# Patient Record
Sex: Female | Born: 1980 | Race: White | Hispanic: No | Marital: Married | Smoking: Never smoker
Health system: Southern US, Community
[De-identification: ages and names within clinical notes are randomized; demographics above are authoritative.]

## PROBLEM LIST (undated history)

## (undated) DIAGNOSIS — R748 Abnormal levels of other serum enzymes: Secondary | ICD-10-CM

## (undated) DIAGNOSIS — O4400 Placenta previa specified as without hemorrhage, unspecified trimester: Secondary | ICD-10-CM

## (undated) DIAGNOSIS — Z8619 Personal history of other infectious and parasitic diseases: Secondary | ICD-10-CM

## (undated) DIAGNOSIS — H3320 Serous retinal detachment, unspecified eye: Secondary | ICD-10-CM

## (undated) HISTORY — PX: RETINAL DETACHMENT SURGERY: SHX105

## (undated) HISTORY — DX: Abnormal levels of other serum enzymes: R74.8

## (undated) HISTORY — DX: Personal history of other infectious and parasitic diseases: Z86.19

---

## 2010-07-28 ENCOUNTER — Other Ambulatory Visit: Admission: RE | Admit: 2010-07-28 | Discharge: 2010-07-28 | Payer: Self-pay | Admitting: Family Medicine

## 2011-05-12 LAB — ANTIBODY SCREEN: Antibody Screen: NEGATIVE

## 2011-05-12 LAB — RPR: RPR: NONREACTIVE

## 2011-05-12 LAB — RUBELLA ANTIBODY, IGM: Rubella: IMMUNE

## 2011-05-12 LAB — ABO/RH: RH Type: NEGATIVE

## 2011-05-12 LAB — HIV ANTIBODY (ROUTINE TESTING W REFLEX): HIV: NONREACTIVE

## 2011-05-12 LAB — HEPATITIS B SURFACE ANTIGEN: Hepatitis B Surface Ag: NEGATIVE

## 2011-06-02 LAB — GC/CHLAMYDIA PROBE AMP, GENITAL
Chlamydia: NEGATIVE
Gonorrhea: NEGATIVE

## 2011-07-05 ENCOUNTER — Other Ambulatory Visit (HOSPITAL_COMMUNITY): Payer: Self-pay | Admitting: Obstetrics and Gynecology

## 2011-07-05 DIAGNOSIS — Z139 Encounter for screening, unspecified: Secondary | ICD-10-CM

## 2011-07-14 ENCOUNTER — Ambulatory Visit (HOSPITAL_COMMUNITY)
Admission: RE | Admit: 2011-07-14 | Discharge: 2011-07-14 | Disposition: A | Discharge status: Home / Self Care | Payer: BC Managed Care – PPO | Source: Ambulatory Visit | Attending: Obstetrics and Gynecology | Admitting: Obstetrics and Gynecology

## 2011-07-14 ENCOUNTER — Encounter (HOSPITAL_COMMUNITY): Payer: Self-pay

## 2011-07-14 ENCOUNTER — Other Ambulatory Visit: Payer: Self-pay | Admitting: Maternal and Fetal Medicine

## 2011-07-14 DIAGNOSIS — Z1389 Encounter for screening for other disorder: Secondary | ICD-10-CM | POA: Insufficient documentation

## 2011-07-14 DIAGNOSIS — Z139 Encounter for screening, unspecified: Secondary | ICD-10-CM

## 2011-07-14 DIAGNOSIS — Z363 Encounter for antenatal screening for malformations: Secondary | ICD-10-CM | POA: Insufficient documentation

## 2011-07-14 DIAGNOSIS — O358XX0 Maternal care for other (suspected) fetal abnormality and damage, not applicable or unspecified: Secondary | ICD-10-CM | POA: Insufficient documentation

## 2011-07-14 NOTE — Progress Notes (Signed)
Please see ultrasound report in [facility] 

## 2011-07-14 NOTE — Progress Notes (Signed)
See ultrasound report in [facility] 

## 2011-07-17 ENCOUNTER — Encounter (HOSPITAL_COMMUNITY): Payer: Self-pay

## 2011-07-17 DIAGNOSIS — Z8489 Family history of other specified conditions: Secondary | ICD-10-CM | POA: Insufficient documentation

## 2011-07-17 NOTE — Progress Notes (Addendum)
Genetic Counseling  High-Risk Gestation Note  Appointment Date:  07/14/2011 Referred By: Jessee Avers., MD Date of Birth:  11-06-1980 Partner:  Jonita Albee Attending: Rica Koyanagi, MD  Pregnancy History: [id number] Estimated Date of Delivery: 11/29/11 Estimated Gestational Age: [redacted]w[redacted]d  Mrs. Sammuel Bailiff and her husband, Mr. 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  Mr. Walstad reported two maternal second cousins (a brother and a sister to each other) with cystinosis. They are 32 years old and 32 years old, and the younger was diagnosed at birth following the diagnosis of her brother. It is unknown at this time whether or not genetic testing has been performed.  Cystinosis is an autosomal recessive lysosomal storage disease divided into 3 clinical types: nephropathic, intermediate, and non-nephropathic. Nephropathic cystinosis accounts for the majority of cases and in untreated individuals is characterized by short stature, rickets, photophobia, and renal tubular Fanconi syndrome by age 32 with renal failure by age 32. Treatment consists of phosphate and vitamin D supplements, cystine depleting therapy, cysteamine hydrochloride eyedrops, possible hormone replacements therapies (L-thyroxine, insulin, growth hormone, and/or testosterone), and renal transplant.   Intermediate cystinosis is characterized by similar manifestations but later age of onset. Non-nephropathic cystinosis is characterized by photophobia due to corneal cystine crystal accumulation.  Diagnosis of nephropathic cystinosis is established by presence of renal tubular Fanconi syndrome, appearance of cystine crystals in the cornea on slit lamp exam, and increased cystine content of leukocytes. Mutations in CTNS gene are known to cause cystinosis.   We reviewed the autosomal recessive inheritance of  cystinosis, where both parents must be carriers for a pregnancy to be at risk to inherit the condition. If both parents are carriers, each pregnancy has a 1 in 4 chance to be affected, a 1 in 2 chance to be a carrier, and a 1 in 4 chance to be neither affected nor a carrier. We discussed that if both parents are known carriers, prenatal diagnosis via amniocentesis could be performed for the known familial mutations. The risks, benefits, and limitations of amniocentesis were reviewed. The prevalence of cystinosis is approximately 1 in 100,000 to 1 in 200,000. Thus, the general population carrier frequency is approximately 1 in 158. Carrier testing for at-risk relatives is available once the familial disease-causing mutation(s) have been identified. Given the reported family history, Mr. Delamar would have a 1 in 8 chance to be a carrier for cystinosis, and Mrs. Kretz would have the general population risk to be a carrier given no known family history nor consanguinity. Thus, the risk for cystinosis in the current pregnancy, prior to carrier testing for Mr. Walski is approximately 1 in 44. Mr. Kreher expressed that he would be interested in carrier testing, but the couple stated that they would not be interested in amniocentesis. The couple expressed interest in carrier testing so that they could potentially plan whether or not to test for cystinosis in the neonatal period. We reviewed that biochemical testing could be performed in the postnatal period regardless of whether or not carrier testing was performed. The couple expressed their understanding but would like to pursue carrier testing. Mr. Castillo plans to obtain medical record information regarding genetic testing results for his second cousins and contact our office once obtained in order to pursue carrier testing for the known familial mutations.   Additionally, Mrs. Racanelli reported a maternal family history of "Anatomical Dystrophy."  She reported that her  maternal grandmother had mild symptoms and passed away in her 63s. Her maternal uncle passed away at age 8, and the condition had progressed for him such that he was blind and in a wheelchair. Her maternal aunt is currently 101 and has recently had symptoms of the condition, described as shaking. Mrs. Meriweather reported that her maternal aunts' two daughters have been tested and were found to also have the condition but are asymptomatic in their 30s and 65s. Mrs. Armenteros's mother was reportedly evaluated at [facility] and was negative for this condition. The patient reported that she was told that this condition can get worse with each generation.   We discussed that the description of the condition in the family is similar with that of myotonic dystrophy. Myotonic dystrophy (DM) is a multisystem disorder characterized by the following progressive symptoms: myotonia (sustained muscle contraction), muscle weakness, pain and wasting, myotonia (sustained muscle contraction), muscle weakness and wasting, endocrine problems including diabetes, cataracts and cardiac conduction problems.  These features can range from mild to severe and may be different for each affected individual.  The life span may be normal or shortened.  There are two types of DM including type 1 (the most common type, noted as DM1) and type 2 (DM2).  They are commonly categorized as mild, classic or congenital.  Congenital DM describes when symptoms are present at or shortly after birth, which can include low muscle tone, respiratory problems, mental retardation and early death.  A congenital form of DM has been documented for DM1 but not for DM2.  Symptomatic treatment is available for many features of Myotonic dystrophy; a cure is not currently available for this condition.  DM1 and DM2 result from an expansion of a segment in either the DMPK gene on chromosome 78 (DM1) or in the CNBP gene on chromosome 3 (DM2). Both follow autosomal dominant  inheritance, where offspring of an affected individual have a 1 in 2 (50%) chance to also inherit the nonworking gene change. We discussed that individuals who do not inherit the mutation are not at increased risk to have a child with the condition. A phenomenon called anticipation has been observed with DM1 and DM2.  This means that when the expanded repeat is transmitted from parent to child, the repeat size is inclined to increase in size.  This means that the child of an affected parent commonly has more repeats than his or her parent has.  There appears to be a direct correlation between repeat size and severity and age-of-onset of symptoms for DM1, meaning the more repeats a person has, the earlier their symptoms typically present and the more severe their symptoms tend to be.  This correlation has not been apparent or consistent for DM2. Clinical genetic testing is available for myotonic dystrophy. Testing is most informative in asymptomatic at-risk relatives when genetic testing has first confirmed the presence of a genetic mutation in affected relatives. Given the reported family history, if Mrs. Galloway's mother has had negative testing for a known mutation causative of an autosomal dominant condition, then Mrs. Alviar would not be at increased risk to have inherited it, which means the current pregnancy would also not be expected to be at increased risk. However, in the case of a different underlying condition and/or in the case that genetic testing was not performed for Mrs. Litzinger's mother, recurrence risk estimate may change. Mrs. Cristiano planned to discuss this history with her mother and obtain medical records  in order to confirm the diagnosis of the condition in the family and the specific testing that her mother had performed.  Without further information regarding the provided family history, an accurate genetic risk cannot be calculated.  Additional information may alter recurrence risk assessment.    The patient denied exposure to environmental toxins or chemical agents.  She denied the use of tobacco or street drugs. She reported drinking alcohol twice in June, prior to being aware of the pregnancy.  Prenatal alcohol exposure can increase the risk for growth delays, small head size, heart defects, eye and facial differences, as well as behavior problems and learning disabilities. The risk of these to occur tends to increase with the amount of alcohol consumed. However, because there is no identified safe amount of alcohol in pregnancy, it is recommended to completely avoid alcohol in pregnancy. Given the reported amount and timing of exposure, risk for associated effects are not likely increased in the current pregnancy. She denied significant viral illnesses during the course of her pregnancy.  Her medical and surgical history were noncontributory.   A complete obstetrical ultrasound was performed at the time of today's evaluation.  The ultrasound report is reported separately.     We counseled the patient for approximately 40 minutes regarding the above risks.     Clydie Braun Rommel Hogston, MS, CGC 07/17/2011    Addendum 07/27/11 Received medical records from patient regarding her mother's normal genetic testing for both myotonic dystrophy 1 (DM1) and myotonic dystrophy 2 (DM2) through [employer]. The patient's mother was seen for genetic counseling at [facility] regarding the family history of myotonic dystrophy. Medical records confirm that the muscle weakness condition in the maternal relatives is myotonic dystrophy type 1 (DM1).  Given that her mother had normal genetic testing for both DM1 and DM2, Mrs. Conradt is not at risk to have inherited the DM1 causative mutation in the family. Thus, she is not at increased risk for myotonic dystrophy, which means that her offspring are also not at increased risk for DM1. Given this information, genetic testing for DM1 is not indicated  for Mrs. Quiett.

## 2011-07-24 ENCOUNTER — Telehealth (HOSPITAL_COMMUNITY): Payer: Self-pay | Admitting: MS"

## 2011-07-24 NOTE — Telephone Encounter (Signed)
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 Discussed with patient that her risk to be carrier is reduced. Patient's mother is sending her medical records regarding the testing performed for family history of dystrophy, and the patient and her partner plan to follow-up with his cousins regarding the history of cystinosis. She will contact me once records are received.

## 2011-07-27 ENCOUNTER — Encounter (HOSPITAL_COMMUNITY): Payer: Self-pay | Admitting: MS"

## 2011-08-25 ENCOUNTER — Ambulatory Visit (HOSPITAL_COMMUNITY)
Admission: RE | Admit: 2011-08-25 | Discharge: 2011-08-25 | Disposition: A | Discharge status: Home / Self Care | Payer: BC Managed Care – PPO | Source: Ambulatory Visit | Attending: Obstetrics and Gynecology | Admitting: Obstetrics and Gynecology

## 2011-08-25 DIAGNOSIS — Z139 Encounter for screening, unspecified: Secondary | ICD-10-CM

## 2011-08-25 DIAGNOSIS — Z3689 Encounter for other specified antenatal screening: Secondary | ICD-10-CM | POA: Insufficient documentation

## 2011-08-25 DIAGNOSIS — O352XX0 Maternal care for (suspected) hereditary disease in fetus, not applicable or unspecified: Secondary | ICD-10-CM | POA: Insufficient documentation

## 2011-08-25 NOTE — Progress Notes (Signed)
Ms. Rish was seen for ultrasound appointment today.  Please see [facility] report for details.

## 2011-10-10 NOTE — L&D Delivery Note (Signed)
Delivery Note At 8:12 PM a viable female was delivered via Vaginal, Spontaneous Delivery (Presentation: ;  ).  APGAR: 8, 9; weight 7 lb 14.3 oz (3580 g).   Placenta status: Intact, Spontaneous.  Cord: 3 vessels with the following complications: None.  Cord pH: na  Anesthesia: Epidural  Episiotomy: None Lacerations: 1st degree Suture Repair: 3.0 vicryl Est. Blood Loss (mL): 800  Mom to postpartum.  Baby to nursery-stable. Error above mom to AICU due to HELLP syndrome for Magnesium sulfate Lamerle Jabs J. 11/30/2011, 8:42 PM

## 2011-10-31 LAB — STREP B DNA PROBE: GBS: NEGATIVE

## 2011-11-08 ENCOUNTER — Inpatient Hospital Stay (HOSPITAL_COMMUNITY): Admission: AD | Admit: 2011-11-08 | Payer: Self-pay | Source: Ambulatory Visit | Admitting: Obstetrics and Gynecology

## 2011-11-28 ENCOUNTER — Telehealth (HOSPITAL_COMMUNITY): Payer: Self-pay | Admitting: *Deleted

## 2011-11-28 ENCOUNTER — Encounter (HOSPITAL_COMMUNITY): Payer: Self-pay | Admitting: *Deleted

## 2011-11-28 NOTE — Telephone Encounter (Signed)
Preadmission screen  

## 2011-11-30 ENCOUNTER — Inpatient Hospital Stay (HOSPITAL_COMMUNITY): Payer: BC Managed Care – PPO | Admitting: Anesthesiology

## 2011-11-30 ENCOUNTER — Other Ambulatory Visit: Payer: Self-pay | Admitting: Obstetrics and Gynecology

## 2011-11-30 ENCOUNTER — Inpatient Hospital Stay (HOSPITAL_COMMUNITY)
Admission: RE | Admit: 2011-11-30 | Discharge: 2011-12-02 | DRG: 372 | Disposition: A | Discharge status: Home / Self Care | Payer: BC Managed Care – PPO | Source: Ambulatory Visit | Attending: Obstetrics and Gynecology | Admitting: Obstetrics and Gynecology

## 2011-11-30 ENCOUNTER — Encounter (HOSPITAL_COMMUNITY): Payer: Self-pay

## 2011-11-30 ENCOUNTER — Encounter (HOSPITAL_COMMUNITY): Payer: Self-pay | Admitting: Anesthesiology

## 2011-11-30 DIAGNOSIS — O1414 Severe pre-eclampsia complicating childbirth: Principal | ICD-10-CM | POA: Diagnosis present

## 2011-11-30 LAB — COMPREHENSIVE METABOLIC PANEL
ALT: 236 U/L — ABNORMAL HIGH (ref 0–35)
AST: 157 U/L — ABNORMAL HIGH (ref 0–37)
Albumin: 2.8 g/dL — ABNORMAL LOW (ref 3.5–5.2)
Alkaline Phosphatase: 234 U/L — ABNORMAL HIGH (ref 39–117)
BUN: 18 mg/dL (ref 6–23)
CO2: 21 mEq/L (ref 19–32)
Calcium: 9.3 mg/dL (ref 8.4–10.5)
Chloride: 103 mEq/L (ref 96–112)
Creatinine, Ser: 1.14 mg/dL — ABNORMAL HIGH (ref 0.50–1.10)
GFR calc Af Amer: 73 mL/min — ABNORMAL LOW (ref >90)
GFR calc non Af Amer: 63 mL/min — ABNORMAL LOW (ref >90)
Glucose, Bld: 72 mg/dL (ref 70–99)
Potassium: 4.9 mEq/L (ref 3.5–5.1)
Sodium: 133 mEq/L — ABNORMAL LOW (ref 135–145)
Total Bilirubin: 0.4 mg/dL (ref 0.3–1.2)
Total Protein: 6.3 g/dL (ref 6.0–8.3)

## 2011-11-30 LAB — CBC
HCT: 43.2 % (ref 36.0–46.0)
HCT: 41.5 % (ref 36.0–46.0)
HCT: 39.3 % (ref 36.0–46.0)
Hemoglobin: 14.6 g/dL (ref 12.0–15.0)
Hemoglobin: 13.9 g/dL (ref 12.0–15.0)
Hemoglobin: 13.2 g/dL (ref 12.0–15.0)
MCH: 30.2 pg (ref 26.0–34.0)
MCH: 29.9 pg (ref 26.0–34.0)
MCH: 30 pg (ref 26.0–34.0)
MCHC: 33.8 g/dL (ref 30.0–36.0)
MCHC: 33.5 g/dL (ref 30.0–36.0)
MCHC: 33.6 g/dL (ref 30.0–36.0)
MCV: 89.3 fL (ref 78.0–100.0)
MCV: 89.2 fL (ref 78.0–100.0)
MCV: 89.3 fL (ref 78.0–100.0)
Platelets: 182 10*3/uL (ref 150–400)
Platelets: 163 10*3/uL (ref 150–400)
Platelets: 171 10*3/uL (ref 150–400)
RBC: 4.84 MIL/uL (ref 3.87–5.11)
RBC: 4.65 MIL/uL (ref 3.87–5.11)
RBC: 4.4 MIL/uL (ref 3.87–5.11)
RDW: 12.4 % (ref 11.5–15.5)
RDW: 12.4 % (ref 11.5–15.5)
RDW: 12.2 % (ref 11.5–15.5)
WBC: 14.3 10*3/uL — ABNORMAL HIGH (ref 4.0–10.5)
WBC: 14.3 10*3/uL — ABNORMAL HIGH (ref 4.0–10.5)
WBC: 22.1 10*3/uL — ABNORMAL HIGH (ref 4.0–10.5)

## 2011-11-30 LAB — DIFFERENTIAL
Basophils Absolute: 0 10*3/uL (ref 0.0–0.1)
Basophils Relative: 0 % (ref 0–1)
Eosinophils Absolute: 0 10*3/uL (ref 0.0–0.7)
Eosinophils Relative: 0 % (ref 0–5)
Lymphocytes Relative: 9 % — ABNORMAL LOW (ref 12–46)
Lymphs Abs: 1.9 10*3/uL (ref 0.7–4.0)
Monocytes Absolute: 0.9 10*3/uL (ref 0.1–1.0)
Monocytes Relative: 4 % (ref 3–12)
Neutro Abs: 19.3 10*3/uL — ABNORMAL HIGH (ref 1.7–7.7)
Neutrophils Relative %: 87 % — ABNORMAL HIGH (ref 43–77)

## 2011-11-30 LAB — APTT: aPTT: 31 seconds (ref 24–37)

## 2011-11-30 LAB — URINALYSIS, ROUTINE W REFLEX MICROSCOPIC
Bilirubin Urine: NEGATIVE
Glucose, UA: NEGATIVE mg/dL
Hgb urine dipstick: NEGATIVE
Ketones, ur: NEGATIVE mg/dL
Nitrite: NEGATIVE
Protein, ur: NEGATIVE mg/dL
Specific Gravity, Urine: 1.01 (ref 1.005–1.030)
Urobilinogen, UA: 0.2 mg/dL (ref 0.0–1.0)
pH: 5.5 (ref 5.0–8.0)

## 2011-11-30 LAB — PROTIME-INR
INR: 0.97 (ref 0.00–1.49)
Prothrombin Time: 13.1 seconds (ref 11.6–15.2)

## 2011-11-30 LAB — ABO/RH: ABO/RH(D): A NEG

## 2011-11-30 LAB — URIC ACID: Uric Acid, Serum: 6.6 mg/dL (ref 2.4–7.0)

## 2011-11-30 LAB — LACTATE DEHYDROGENASE: LDH: 353 U/L — ABNORMAL HIGH (ref 94–250)

## 2011-11-30 LAB — URINE MICROSCOPIC-ADD ON

## 2011-11-30 LAB — RPR: RPR Ser Ql: NONREACTIVE

## 2011-11-30 MED ORDER — LIDOCAINE HCL (PF) 1 % IJ SOLN
30.0000 mL | INTRAMUSCULAR | Status: DC | PRN
  Filled 2011-11-30: qty 30

## 2011-11-30 MED ORDER — LACTATED RINGERS IV SOLN: 500.0000 mL | INTRAVENOUS | Status: DC | PRN

## 2011-11-30 MED ORDER — OXYCODONE-ACETAMINOPHEN 5-325 MG PO TABS: 1.0000 | ORAL_TABLET | ORAL | Status: DC | PRN

## 2011-11-30 MED ORDER — ZOLPIDEM TARTRATE 5 MG PO TABS: 5.0000 mg | ORAL_TABLET | Freq: Every evening | ORAL | Status: DC | PRN

## 2011-11-30 MED ORDER — TETANUS-DIPHTH-ACELL PERTUSSIS 5-2.5-18.5 LF-MCG/0.5 IM SUSP
0.5000 mL | Freq: Once | INTRAMUSCULAR | Status: AC
  Administered 2011-12-01: 0.5 mL via INTRAMUSCULAR
  Filled 2011-11-30: qty 0.5

## 2011-11-30 MED ORDER — CARBOPROST TROMETHAMINE 250 MCG/ML IM SOLN
INTRAMUSCULAR | Status: AC
  Filled 2011-11-30: qty 1

## 2011-11-30 MED ORDER — LANOLIN HYDROUS EX OINT: TOPICAL_OINTMENT | CUTANEOUS | Status: DC | PRN

## 2011-11-30 MED ORDER — IBUPROFEN 600 MG PO TABS: 600.0000 mg | ORAL_TABLET | Freq: Four times a day (QID) | ORAL | Status: DC | PRN

## 2011-11-30 MED ORDER — OXYTOCIN BOLUS FROM INFUSION
500.0000 mL | Freq: Once | INTRAVENOUS | Status: DC
  Filled 2011-11-30: qty 500

## 2011-11-30 MED ORDER — FENTANYL 2.5 MCG/ML BUPIVACAINE 1/10 % EPIDURAL INFUSION (WH - ANES)
14.0000 mL/h | INTRAMUSCULAR | Status: DC
Start: 2011-11-30 — End: 2011-11-30
  Administered 2011-11-30 (×2): 14 mL/h via EPIDURAL
  Filled 2011-11-30 (×2): qty 60

## 2011-11-30 MED ORDER — IBUPROFEN 600 MG PO TABS
600.0000 mg | ORAL_TABLET | Freq: Four times a day (QID) | ORAL | Status: DC
  Administered 2011-12-01 – 2011-12-02 (×7): 600 mg via ORAL
  Filled 2011-11-30 (×7): qty 1

## 2011-11-30 MED ORDER — FLEET ENEMA 7-19 GM/118ML RE ENEM: 1.0000 | ENEMA | RECTAL | Status: DC | PRN

## 2011-11-30 MED ORDER — DIPHENHYDRAMINE HCL 25 MG PO CAPS: 25.0000 mg | ORAL_CAPSULE | Freq: Four times a day (QID) | ORAL | Status: DC | PRN

## 2011-11-30 MED ORDER — SENNOSIDES-DOCUSATE SODIUM 8.6-50 MG PO TABS
2.0000 | ORAL_TABLET | Freq: Every day | ORAL | Status: DC
  Administered 2011-12-01: 2 via ORAL

## 2011-11-30 MED ORDER — EPHEDRINE 5 MG/ML INJ: 10.0000 mg | INTRAVENOUS | Status: DC | PRN

## 2011-11-30 MED ORDER — MAGNESIUM SULFATE BOLUS VIA INFUSION
4.0000 g | Freq: Once | INTRAVENOUS | Status: AC
  Administered 2011-11-30: 4 g via INTRAVENOUS
  Filled 2011-11-30: qty 500

## 2011-11-30 MED ORDER — SIMETHICONE 80 MG PO CHEW: 80.0000 mg | CHEWABLE_TABLET | ORAL | Status: DC | PRN

## 2011-11-30 MED ORDER — ONDANSETRON HCL 4 MG/2ML IJ SOLN: 4.0000 mg | INTRAMUSCULAR | Status: DC | PRN

## 2011-11-30 MED ORDER — MISOPROSTOL 200 MCG PO TABS
1000.0000 ug | ORAL_TABLET | Freq: Once | ORAL | Status: AC
  Administered 2011-11-30: 1000 ug via VAGINAL

## 2011-11-30 MED ORDER — LACTATED RINGERS IV SOLN
INTRAVENOUS | Status: DC
  Administered 2011-12-01: 05:00:00 via INTRAVENOUS

## 2011-11-30 MED ORDER — DIBUCAINE 1 % RE OINT: 1.0000 "application " | TOPICAL_OINTMENT | RECTAL | Status: DC | PRN

## 2011-11-30 MED ORDER — ONDANSETRON HCL 4 MG/2ML IJ SOLN: 4.0000 mg | Freq: Four times a day (QID) | INTRAMUSCULAR | Status: DC | PRN

## 2011-11-30 MED ORDER — LACTATED RINGERS IV SOLN: 500.0000 mL | Freq: Once | INTRAVENOUS | Status: DC

## 2011-11-30 MED ORDER — ONDANSETRON HCL 4 MG PO TABS: 4.0000 mg | ORAL_TABLET | ORAL | Status: DC | PRN

## 2011-11-30 MED ORDER — CITRIC ACID-SODIUM CITRATE 334-500 MG/5ML PO SOLN: 30.0000 mL | ORAL | Status: DC | PRN

## 2011-11-30 MED ORDER — PHENYLEPHRINE 40 MCG/ML (10ML) SYRINGE FOR IV PUSH (FOR BLOOD PRESSURE SUPPORT): 80.0000 ug | PREFILLED_SYRINGE | INTRAVENOUS | Status: DC | PRN

## 2011-11-30 MED ORDER — DIPHENHYDRAMINE HCL 50 MG/ML IJ SOLN: 12.5000 mg | INTRAMUSCULAR | Status: DC | PRN

## 2011-11-30 MED ORDER — ACETAMINOPHEN 325 MG PO TABS: 650.0000 mg | ORAL_TABLET | ORAL | Status: DC | PRN

## 2011-11-30 MED ORDER — PRENATAL MULTIVITAMIN CH
1.0000 | ORAL_TABLET | Freq: Every day | ORAL | Status: DC
  Administered 2011-12-01 – 2011-12-02 (×2): 1 via ORAL
  Filled 2011-11-30 (×2): qty 1

## 2011-11-30 MED ORDER — SODIUM BICARBONATE 8.4 % IV SOLN
INTRAVENOUS | Status: DC | PRN
  Administered 2011-11-30: 4 mL via EPIDURAL

## 2011-11-30 MED ORDER — BENZOCAINE-MENTHOL 20-0.5 % EX AERO
1.0000 "application " | INHALATION_SPRAY | CUTANEOUS | Status: DC | PRN
  Administered 2011-12-01: 1 via TOPICAL

## 2011-11-30 MED ORDER — OXYTOCIN 20 UNITS IN LACTATED RINGERS INFUSION - SIMPLE
125.0000 mL/h | Freq: Once | INTRAVENOUS | Status: AC
  Administered 2011-11-30: 999 mL/h via INTRAVENOUS

## 2011-11-30 MED ORDER — FENTANYL 2.5 MCG/ML BUPIVACAINE 1/10 % EPIDURAL INFUSION (WH - ANES)
INTRAMUSCULAR | Status: DC | PRN
  Administered 2011-11-30: 14 mL/h via EPIDURAL

## 2011-11-30 MED ORDER — WITCH HAZEL-GLYCERIN EX PADS: 1.0000 "application " | MEDICATED_PAD | CUTANEOUS | Status: DC | PRN

## 2011-11-30 MED ORDER — LACTATED RINGERS IV SOLN
INTRAVENOUS | Status: DC
  Administered 2011-11-30: 500 mL via INTRAVENOUS
  Administered 2011-11-30 (×2): via INTRAVENOUS

## 2011-11-30 MED ORDER — OXYTOCIN 20 UNITS IN LACTATED RINGERS INFUSION - SIMPLE
1.0000 m[IU]/min | INTRAVENOUS | Status: DC
Start: 2011-11-30 — End: 2011-11-30
  Administered 2011-11-30: 2 m[IU]/min via INTRAVENOUS
  Filled 2011-11-30: qty 1000

## 2011-11-30 MED ORDER — OXYCODONE-ACETAMINOPHEN 5-325 MG PO TABS
1.0000 | ORAL_TABLET | ORAL | Status: DC | PRN
  Administered 2011-12-01 (×2): 1 via ORAL
  Filled 2011-11-30 (×2): qty 1

## 2011-11-30 MED ORDER — MISOPROSTOL 200 MCG PO TABS
ORAL_TABLET | ORAL | Status: AC
  Filled 2011-11-30: qty 5

## 2011-11-30 MED ORDER — TERBUTALINE SULFATE 1 MG/ML IJ SOLN: 0.2500 mg | Freq: Once | INTRAMUSCULAR | Status: DC | PRN

## 2011-11-30 MED ORDER — PHENYLEPHRINE 40 MCG/ML (10ML) SYRINGE FOR IV PUSH (FOR BLOOD PRESSURE SUPPORT)
80.0000 ug | PREFILLED_SYRINGE | INTRAVENOUS | Status: DC | PRN
  Filled 2011-11-30: qty 5

## 2011-11-30 MED ORDER — BUTORPHANOL TARTRATE 2 MG/ML IJ SOLN: 1.0000 mg | INTRAMUSCULAR | Status: DC | PRN

## 2011-11-30 MED ORDER — EPHEDRINE 5 MG/ML INJ
10.0000 mg | INTRAVENOUS | Status: DC | PRN
  Filled 2011-11-30: qty 4

## 2011-11-30 MED ORDER — MAGNESIUM SULFATE 40 G IN LACTATED RINGERS - SIMPLE
2.0000 g/h | INTRAVENOUS | Status: DC
  Filled 2011-11-30 (×2): qty 500

## 2011-11-30 NOTE — Anesthesia Procedure Notes (Signed)

## 2011-11-30 NOTE — Progress Notes (Signed)
In to assess patient. She denies headache/ blurred vision or ruq pain.  Last bp 157/100 LFT's are elevated.  Cx 2.5/75/-1 AROM clear fluid IUPC placed  FHR baseline 130 good btbv +accels no decels  Toco ctx q 2 min on 12 mu of pitocin   A/P 40 wks with PIH/ Elevated Liver Enzymes most likely variant of HELLP Given elevated bp's and lft's Will start Magnesium for sz prophylaxis Continue Pitocin. Anticipate SVD

## 2011-11-30 NOTE — Progress Notes (Signed)
In to assess patient  Cx complete +2 station FHR baseline 110's with good btbv +accels no decels  toco ctx q A/p 40 wks variant of HELLP Syndrome Complete- pushing  Anticipate SVD

## 2011-11-30 NOTE — Anesthesia Preprocedure Evaluation (Addendum)
Anesthesia Evaluation  Patient identified by MRN, date of birth, ID band Patient awake    Reviewed: Allergy & Precautions, H&P , Patient's Chart, lab work & pertinent test results  Airway Mallampati: II TM Distance: >3 FB Neck ROM: full    Dental  (+) Teeth Intact   Pulmonary  clear to auscultation        Cardiovascular hypertension (Mg+2 for HTN, Labs okay), regular Normal    Neuro/Psych    GI/Hepatic   Endo/Other    Renal/GU      Musculoskeletal   Abdominal   Peds  Hematology   Anesthesia Other Findings HEELP??..............Marland KitchenHTN and  Incr LFT's      Reproductive/Obstetrics (+) Pregnancy                          Anesthesia Physical Anesthesia Plan  ASA: III  Anesthesia Plan: Epidural   Post-op Pain Management:    Induction:   Airway Management Planned:   Additional Equipment:   Intra-op Plan:   Post-operative Plan:   Informed Consent: I have reviewed the patients History and Physical, chart, labs and discussed the procedure including the risks, benefits and alternatives for the proposed anesthesia with the patient or authorized representative who has indicated his/her understanding and acceptance.   Dental Advisory Given  Plan Discussed with:   Anesthesia Plan Comments: (Repeat Labs checked- platelets confirmed with RN in room. Pt/ Ptt are okay.  Fetal heart tracing, per RN, reported to be stable enough for sitting procedure. Discussed epidural, and patient consents to the procedure:  included risk of possible headache,backache, failed block, allergic reaction, and nerve injury. This patient was asked if she had any questions or concerns before the procedure started.  )        Anesthesia Quick Evaluation

## 2011-11-30 NOTE — Progress Notes (Signed)
NSVD of a viable female.  

## 2011-12-01 LAB — CBC
HCT: 36.4 % (ref 36.0–46.0)
Hemoglobin: 12.4 g/dL (ref 12.0–15.0)
MCH: 30.1 pg (ref 26.0–34.0)
MCHC: 34.1 g/dL (ref 30.0–36.0)
MCV: 88.3 fL (ref 78.0–100.0)
Platelets: 155 10*3/uL (ref 150–400)
RBC: 4.12 MIL/uL (ref 3.87–5.11)
RDW: 12.4 % (ref 11.5–15.5)
WBC: 21.2 10*3/uL — ABNORMAL HIGH (ref 4.0–10.5)

## 2011-12-01 LAB — COMPREHENSIVE METABOLIC PANEL
ALT: 170 U/L — ABNORMAL HIGH (ref 0–35)
AST: 91 U/L — ABNORMAL HIGH (ref 0–37)
Albumin: 2.4 g/dL — ABNORMAL LOW (ref 3.5–5.2)
Alkaline Phosphatase: 164 U/L — ABNORMAL HIGH (ref 39–117)
BUN: 12 mg/dL (ref 6–23)
CO2: 25 mEq/L (ref 19–32)
Calcium: 7.4 mg/dL — ABNORMAL LOW (ref 8.4–10.5)
Chloride: 102 mEq/L (ref 96–112)
Creatinine, Ser: 1.17 mg/dL — ABNORMAL HIGH (ref 0.50–1.10)
GFR calc Af Amer: 71 mL/min — ABNORMAL LOW (ref >90)
GFR calc non Af Amer: 61 mL/min — ABNORMAL LOW (ref >90)
Glucose, Bld: 89 mg/dL (ref 70–99)
Potassium: 4.2 mEq/L (ref 3.5–5.1)
Sodium: 134 mEq/L — ABNORMAL LOW (ref 135–145)
Total Bilirubin: 0.5 mg/dL (ref 0.3–1.2)
Total Protein: 5.4 g/dL — ABNORMAL LOW (ref 6.0–8.3)

## 2011-12-01 LAB — MRSA PCR SCREENING: MRSA by PCR: NEGATIVE

## 2011-12-01 LAB — LACTATE DEHYDROGENASE: LDH: 247 U/L (ref 94–250)

## 2011-12-01 LAB — MAGNESIUM: Magnesium: 6.7 mg/dL (ref 1.5–2.5)

## 2011-12-01 MED ORDER — BENZOCAINE-MENTHOL 20-0.5 % EX AERO
INHALATION_SPRAY | CUTANEOUS | Status: AC
  Administered 2011-12-01: 1 via TOPICAL
  Filled 2011-12-01: qty 56

## 2011-12-01 MED ORDER — RHO D IMMUNE GLOBULIN 1500 UNIT/2ML IJ SOLN
300.0000 ug | Freq: Once | INTRAMUSCULAR | Status: AC
  Administered 2011-12-02: 300 ug via INTRAVENOUS
  Filled 2011-12-01: qty 2

## 2011-12-01 NOTE — Progress Notes (Signed)
Post Partum Day 1 s/p vaginal delivery/ pt had HELLP syndrome  Subjective: no complaints She denies headache/ visual changes or RUQ pain. .. UOP is adequate. Lochia has decreased.  Breastfeeding well.   Objective: Blood pressure 132/92, pulse 70, temperature 98.1 F (36.7 C), temperature source Oral, resp. rate 18, height 5\' 8"  (1.727 m), weight 79.561 kg (175 lb 6.4 oz), SpO2 100.00%, unknown if currently breastfeeding.  Physical Exam:  General: alert and cooperative Lochia: appropriate Uterine Fundus: firm Incision: NA DVT Evaluation: No evidence of DVT seen on physical exam. 2+ reflexes trace edema CV rrr  Lungs Clear   Basename 12/01/11 0520 11/30/11 2220  HGB 12.4 13.2  HCT 36.4 39.3    Assessment/Plan: Breastfeeding HELLP syndrome.. Liver enzymes are improving... Plan to d/c magnesium at 6 pm.. Will check labs in AM If BP elevated after Magnesium .Marland Kitchen Start procardia XL 30 mg po daily  Dr. Neva Seat covering 12/01/2011 -12/03/2011    LOS: 1 day   Michaela Stanley J. 12/01/2011, 1:11 PM

## 2011-12-01 NOTE — Progress Notes (Signed)
UR chart review completed.  

## 2011-12-01 NOTE — Progress Notes (Signed)
New admit - pt rec'd from [facility] via wheelchair.  HELLP variant per Dr. Richardson Dopp, on Magnesium Sulfate therapy.  SVD 2/21 at 2012.  Female infant, wt 7lbs., 14os., apgars 8/9.  Breastfeeding - CN.  ML IV LR inf at 28ml/hr on admission, Magnesium Sulfate inf at 2gms/73ml/hr.  Transferred easily to bed, gait steady. SR up x 2, call light at [facility].   Pt alert, responsive and appropriate.  Foley catheter to BSD.  Husband, family acc pt - baby in crib.   Oriented to room.

## 2011-12-01 NOTE — Progress Notes (Signed)
Patient transferred to room 126, vital signs stable, no c/o. To room 126 via wheelchir with baby in bassinet.

## 2011-12-02 LAB — COMPREHENSIVE METABOLIC PANEL
ALT: 111 U/L — ABNORMAL HIGH (ref 0–35)
AST: 54 U/L — ABNORMAL HIGH (ref 0–37)
Albumin: 2.4 g/dL — ABNORMAL LOW (ref 3.5–5.2)
Alkaline Phosphatase: 160 U/L — ABNORMAL HIGH (ref 39–117)
BUN: 16 mg/dL (ref 6–23)
CO2: 25 mEq/L (ref 19–32)
Calcium: 6.8 mg/dL — ABNORMAL LOW (ref 8.4–10.5)
Chloride: 102 mEq/L (ref 96–112)
Creatinine, Ser: 1.21 mg/dL — ABNORMAL HIGH (ref 0.50–1.10)
GFR calc Af Amer: 68 mL/min — ABNORMAL LOW (ref >90)
GFR calc non Af Amer: 59 mL/min — ABNORMAL LOW (ref >90)
Glucose, Bld: 90 mg/dL (ref 70–99)
Potassium: 3.5 mEq/L (ref 3.5–5.1)
Sodium: 134 mEq/L — ABNORMAL LOW (ref 135–145)
Total Bilirubin: 0.2 mg/dL — ABNORMAL LOW (ref 0.3–1.2)
Total Protein: 5.2 g/dL — ABNORMAL LOW (ref 6.0–8.3)

## 2011-12-02 LAB — CBC
HCT: 37.1 % (ref 36.0–46.0)
Hemoglobin: 12.1 g/dL (ref 12.0–15.0)
MCH: 29.7 pg (ref 26.0–34.0)
MCHC: 32.6 g/dL (ref 30.0–36.0)
MCV: 90.9 fL (ref 78.0–100.0)
Platelets: 149 10*3/uL — ABNORMAL LOW (ref 150–400)
RBC: 4.08 MIL/uL (ref 3.87–5.11)
RDW: 12.9 % (ref 11.5–15.5)
WBC: 14.2 10*3/uL — ABNORMAL HIGH (ref 4.0–10.5)

## 2011-12-02 LAB — LACTATE DEHYDROGENASE: LDH: 320 U/L — ABNORMAL HIGH (ref 94–250)

## 2011-12-02 NOTE — Progress Notes (Signed)
Post Partum Day 2 Subjective: no complaints, up ad lib and tolerating PO  Objective: Blood pressure 126/76, pulse 71, temperature 98.6 F (37 C), temperature source Oral, resp. rate 20, height 5\' 8"  (1.727 m), weight 175 lb 6.4 oz (79.561 kg), SpO2 97.00%, unknown if currently breastfeeding.  Physical Exam:  General: alert, cooperative and no distress Lochia: appropriate Uterine Fundus: firm Episiotomy, laceration : no significant drainage DVT Evaluation: No evidence of DVT seen on physical exam.   Basename 12/02/11 0535 12/01/11 0520  HGB 12.1 12.4  HCT 37.1 36.4    Assessment/Plan: Discharge home   LOS: 2 days   Aaric Dolph E 12/02/2011, 10:35 AM

## 2011-12-02 NOTE — Discharge Summary (Signed)
Obstetric Discharge Summary Reason for Admission: induction of labor Prenatal Procedures: none Intrapartum Procedures: spontaneous vaginal delivery Postpartum Procedures: Magnesium sulfate for HEELP syndrome Complications-Operative and Postpartum: none  Hemoglobin  Date Value Range Status  12/02/2011 12.1  12.0-15.0 (g/dL) Final     HCT  Date Value Range Status  12/02/2011 37.1  36.0-46.0 (%) Final    Discharge Diagnoses: Term Pregnancy-delivered  Discharge Information: Date: 12/02/2011 Activity: pelvic rest Diet: routine and low sodium Medications: Ibuprofen Condition: stable Instructions: refer to practice specific booklet Discharge to: home   Newborn Data: Live born  Information for the patient's newborn:  Shann, Lewellyn [[id number]]  female ; APGAR , ; weight ;  Home with mother.  Neha Waight E 12/02/2011, 10:38 AM

## 2011-12-02 NOTE — Anesthesia Postprocedure Evaluation (Signed)
  Anesthesia Post-op Note  Patient: Michaela Stanley  Procedure(s) Performed: * No procedures listed *  Patient Location: Mother/Baby  Anesthesia Type: Epidural  Level of Consciousness: awake, alert  and oriented  Airway and Oxygen Therapy: Patient Spontanous Breathing  Post-op Pain: mild  Post-op Assessment: Patient's Cardiovascular Status Stable and Respiratory Function Stable  Post-op Vital Signs: stable  Complications: No apparent anesthesia complications

## 2011-12-03 LAB — RH IG WORKUP (INCLUDES ABO/RH)
ABO/RH(D): A NEG
Antibody Screen: POSITIVE
DAT, IgG: NEGATIVE
Fetal Screen: NEGATIVE
Gestational Age(Wks): 40.1
Unit division: 0

## 2011-12-05 ENCOUNTER — Inpatient Hospital Stay (HOSPITAL_COMMUNITY): Admission: RE | Admit: 2011-12-05 | Payer: BC Managed Care – PPO | Source: Ambulatory Visit

## 2011-12-15 ENCOUNTER — Ambulatory Visit (HOSPITAL_COMMUNITY)
Admission: RE | Admit: 2011-12-15 | Discharge: 2011-12-15 | Disposition: A | Discharge status: Home / Self Care | Payer: BC Managed Care – PPO | Source: Ambulatory Visit | Attending: Obstetrics and Gynecology | Admitting: Obstetrics and Gynecology

## 2011-12-15 NOTE — Progress Notes (Addendum)
Infant Lactation Consultation Outpatient Visit Note  Patient Name: Michaela Stanley  Baby:  Malachi Carl Date of Birth: [birth year]  Baby Birthdate:  [birth year]  (39 days old today) Birth Weight:  7-14 Gestational Age at Delivery: 81 Type of Delivery: Vaginal delivery  Breastfeeding History Frequency of Breastfeeding: Every 2 to 3 hours during the day 20 minutes on each breast and then give 1 oz of formula.  Clusterfed for last 2 nights, but before then was going every 4 hours at night (2 times at night). Length of Feeding: 20 minutes on each breast  (When I observed feeding, was doing mostly nonnutritive sucking.) Voids: 4 to 5 Stools: 1 to 2  Supplementing / Method: Pumping:  Type of Pump: Only has a manual pump which was not really getting any milk when pumped occ   Frequency:  Volume:    Comments:    Consultation Evaluation:  Mom's breasts were soft and nipples well everted.  Shepperd had receding chin.  Once we got him to breast he was not sucking effectively and lower lip was curved inward.  Most of the sucking was nonnutritive with minimal swallowing heard.  When we massaged and squeezed the breast, he began a few rhythmic sucks and swallows.  It was difficult to get good deep latch in cradle hold.  We placed the SNS on the second breast with him sitting more upright in football hold.  Demonstrated how to get wide open mouth with lip flanged out in football hold.  He began good rhythmic sucks and swallows with SNS at the breast.    Initial Feeding Assessment:  Nude weight was 7-6 (3344grams) Pre-feed Weight with dry pamper:  7-6.7 (3366)  Post-feed Weight:  7-7.2 (3380) Amount Transferred:  14ml without SNS with lots of stimulation and breast massaging to keep him sucking Comments:  Additional Feeding Assessment: Pre-feed Weight:  7-7.2 (3380) Post-feed Weight:  7-8.9  (3424)  Amount Transferred:  44ml Comments:  20ml of the 44ml was formula by the SNS leaving 24ml from the  breast  Additional Feeding Assessment: Pre-feed Weight: Post-feed Weight: Amount Transferred: Comments:  Total Breast milk Transferred this Visit: 58ml (38ml from the breast & 20ml of formula via SNS) Total Supplement Given: 20ml  Additional Interventions:  Plan for mom to breastfeed every 2 to 3 hours during the day and every 3 to 4 at night  (at least 8 feedings in 24 hours).  Set up SNS with at least 40 ml and/or whatever you pump on the second breast.  If does not take at least 40ml at the breast via SNS, then give with SNS via fingerfeeding.  Post pump during the day for 20 minutes.  Make sure Shepperd has a good wide open mouth with bottom lip flanged outward.  Plans to take Fenugreek and/or Moringa to increase milk supply along with pumping and breast massage.  Make sure we are having 6 to 8 voids and 3 to 4 yellow, seedy stools in 24 hours.   Follow-Up:  Plans to come to Mothers support group on Tuesday and another out patient lactation consult scheduled Wednesday, March 13th.  Call with any problems with plan of care or concerns.      Louis Meckel 12/15/2011, 10:29 AM

## 2011-12-20 ENCOUNTER — Encounter (HOSPITAL_COMMUNITY): Payer: BC Managed Care – PPO

## 2011-12-25 NOTE — H&P (Signed)
Michaela Stanley is a 31 y.o. female presenting for induction with variant of HELLP syndrome .Marland Kitchen See OB Prenatal record for complete history   OB History as of 12/15/11    Grav Para Term Preterm Abortions TAB SAB Ect Mult Living   1 1 1  0 0 0 0 0 0 1     Past Medical History  Diagnosis Date  . History of chicken pox   . Hypertension   . Elevated liver enzymes    History reviewed. No pertinent past surgical history. Family History: family history includes Asthma in her brother; Cancer in her father and mother; Learning disabilities in her brother; and Other in her maternal aunt, maternal grandmother, maternal uncle, and other. Social History:  reports that she has never smoked. She has never used smokeless tobacco. She reports that she does not drink alcohol or use illicit drugs.  Ros negative except as stated in HPI   Dilation: 10 Effacement (%): 100 Station: +1 Exam by:: Dr. Richardson Dopp Blood pressure 142/87, pulse 66, temperature 98.6 F (37 C), temperature source Oral, resp. rate 20, height 5\' 8"  (1.727 m), weight 79.561 kg (175 lb 6.4 oz), SpO2 97.00%, unknown if currently breastfeeding. CV rrr Lungs clear adomen gravid nontender  Ext trace edema  Prenatal labs: ABO, Rh: --/--/A NEG (02/22 0520) Antibody: POS (02/22 0520) Rubella:   RPR: NON REACTIVE (02/21 0735)  HBsAg: Negative (08/03 0000)  HIV: Non-reactive (08/03 0000)  GBS: Negative (01/22 0000)   Assessment/Plan: A/P 40 wks with PIH/ Elevated Liver Enzymes most likely variant of HELLP  Given elevated bp's and lft's  Will start Magnesium for sz prophylaxis  Continue Pitocin.  Anticipate SVD        Michaela Stanley J. 12/25/2011, 1:19 PM

## 2012-01-18 IMAGING — US US OB FOLLOW-UP
1 series · 14 of 28 positions shown · non-contrast
Comparison: none

[Series 1: us ob follow-up · 0.28mm/px · 14 of 51 slices shown]
[im 2/51]
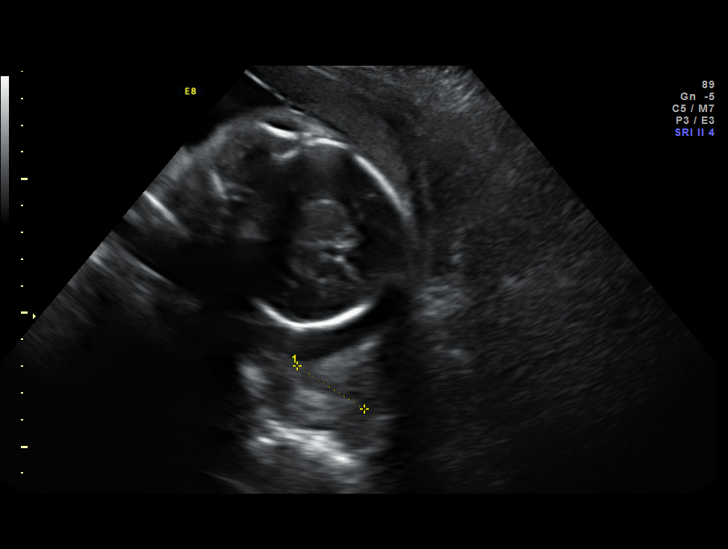
[im 6/51]
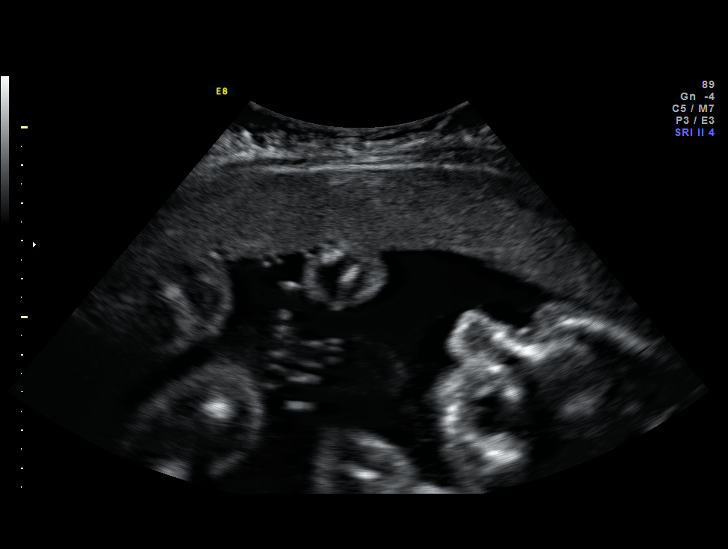
[im 10/51]
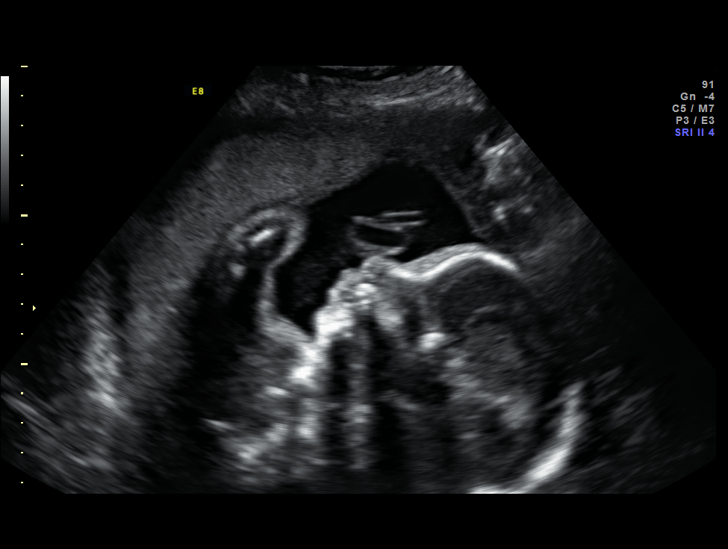
[im 13/51]
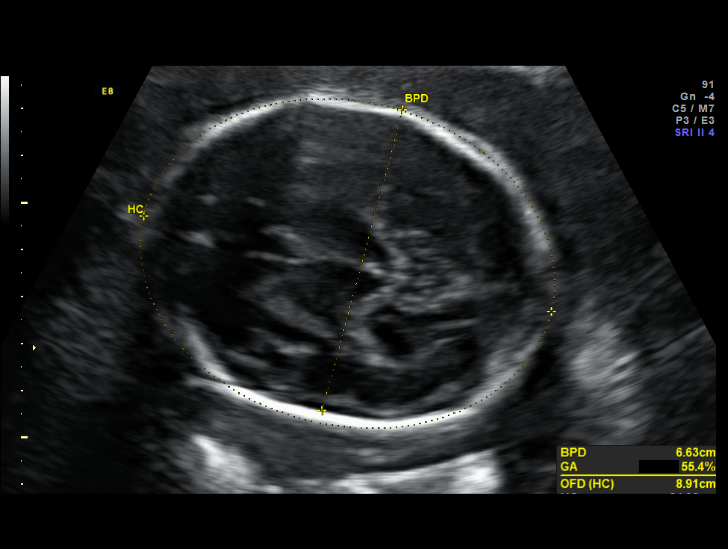
[im 17/51]
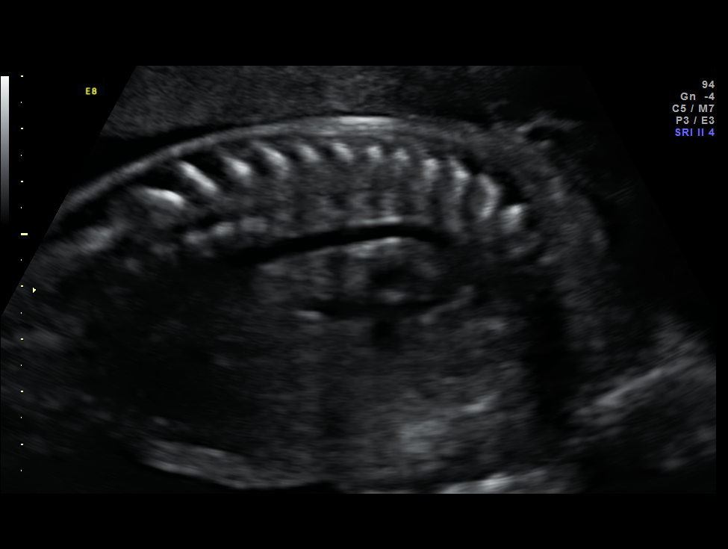
[im 21/51]
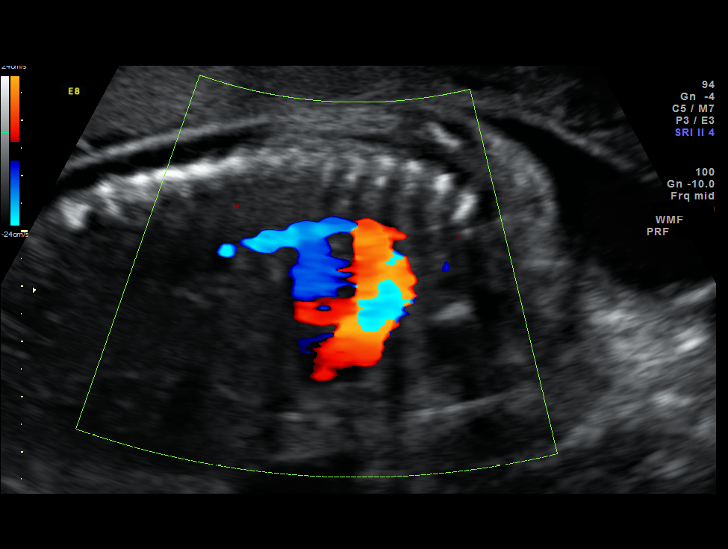
[im 25/51]
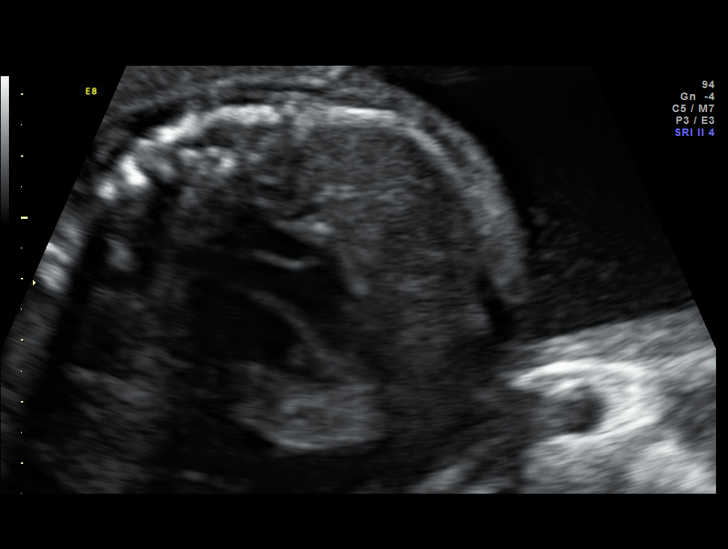
[im 28/51]
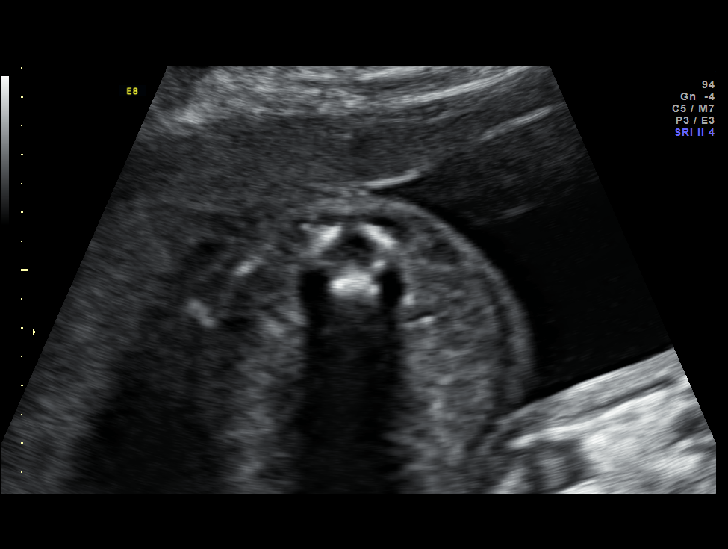
[im 32/51]
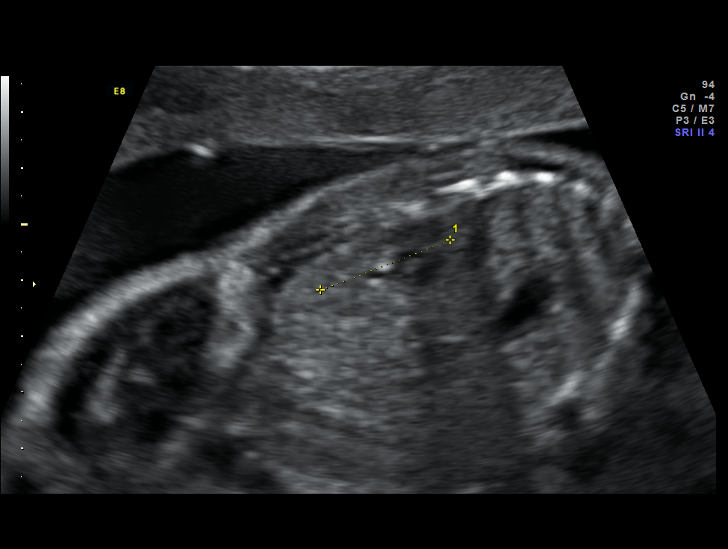
[im 36/51]
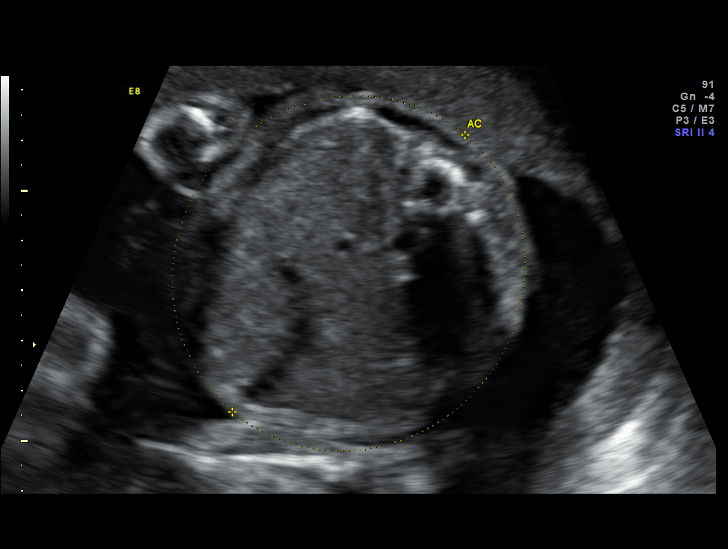
[im 39/51]
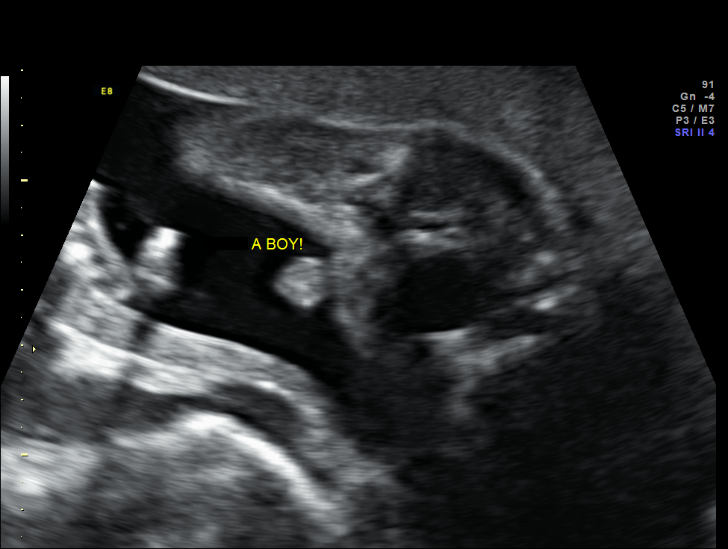
[im 43/51]
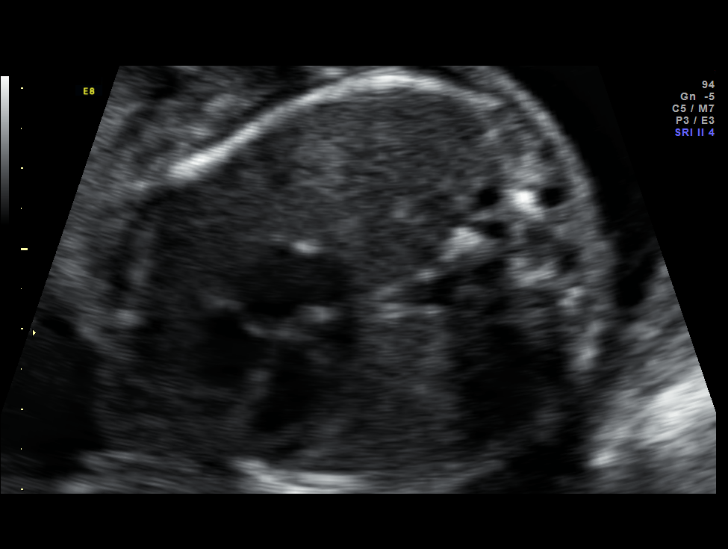
[im 47/51]
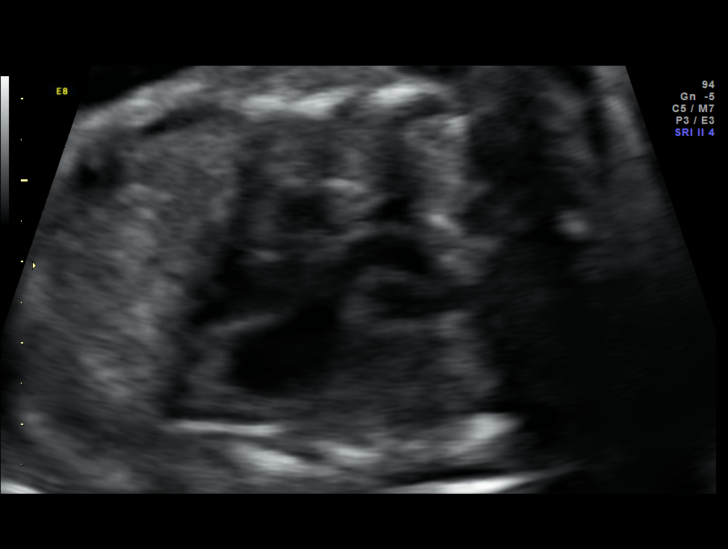
[im 51/51]
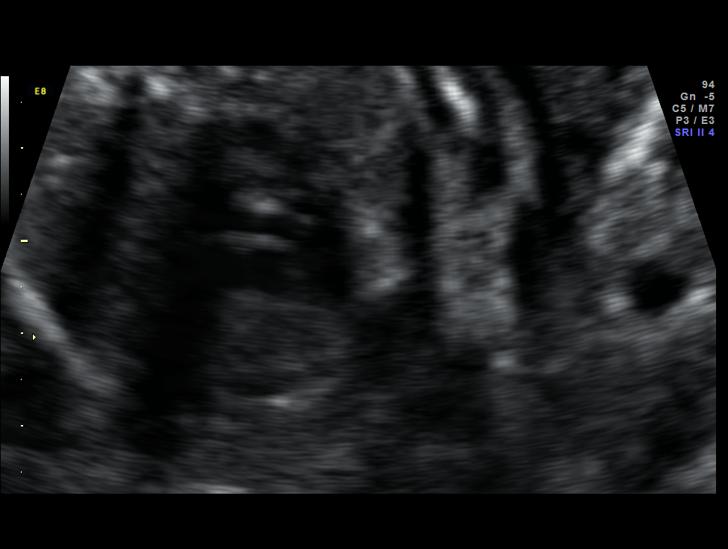

[14 of 28 positions shown; findings below may reference images not displayed]

Canned report from images found in remote index.

Refer to host system for actual result text.

## 2012-03-29 ENCOUNTER — Other Ambulatory Visit: Payer: Self-pay | Admitting: Obstetrics and Gynecology

## 2012-03-29 ENCOUNTER — Other Ambulatory Visit (HOSPITAL_COMMUNITY)
Admission: RE | Admit: 2012-03-29 | Discharge: 2012-03-29 | Disposition: A | Discharge status: Home / Self Care | Payer: BC Managed Care – PPO | Source: Ambulatory Visit | Attending: Obstetrics and Gynecology | Admitting: Obstetrics and Gynecology

## 2012-03-29 DIAGNOSIS — Z1159 Encounter for screening for other viral diseases: Secondary | ICD-10-CM | POA: Insufficient documentation

## 2012-03-29 DIAGNOSIS — Z01419 Encounter for gynecological examination (general) (routine) without abnormal findings: Secondary | ICD-10-CM | POA: Insufficient documentation

## 2013-04-02 ENCOUNTER — Other Ambulatory Visit (HOSPITAL_COMMUNITY)
Admission: RE | Admit: 2013-04-02 | Discharge: 2013-04-02 | Disposition: A | Discharge status: Home / Self Care | Payer: Managed Care, Other (non HMO) | Source: Ambulatory Visit | Attending: Obstetrics and Gynecology | Admitting: Obstetrics and Gynecology

## 2013-04-02 ENCOUNTER — Other Ambulatory Visit: Payer: Self-pay | Admitting: Obstetrics and Gynecology

## 2013-04-02 DIAGNOSIS — Z124 Encounter for screening for malignant neoplasm of cervix: Secondary | ICD-10-CM | POA: Insufficient documentation

## 2014-04-07 ENCOUNTER — Other Ambulatory Visit: Payer: Self-pay | Admitting: Obstetrics and Gynecology

## 2014-04-07 ENCOUNTER — Other Ambulatory Visit (HOSPITAL_COMMUNITY)
Admission: RE | Admit: 2014-04-07 | Discharge: 2014-04-07 | Disposition: A | Discharge status: Home / Self Care | Payer: Managed Care, Other (non HMO) | Source: Ambulatory Visit | Attending: Obstetrics and Gynecology | Admitting: Obstetrics and Gynecology

## 2014-04-07 DIAGNOSIS — Z01419 Encounter for gynecological examination (general) (routine) without abnormal findings: Secondary | ICD-10-CM | POA: Insufficient documentation

## 2014-04-08 LAB — CYTOLOGY - PAP

## 2014-08-10 ENCOUNTER — Encounter (HOSPITAL_COMMUNITY): Payer: Self-pay

## 2014-10-09 NOTE — L&D Delivery Note (Addendum)
Delivery Note At 3:29 AM a viable female, "Huston Foley", was delivered via Vaginal, Spontaneous Delivery (Presentation: LOT ;  ).  APGAR: 8, 9; weight  .   Placenta status: Intact, Spontaneous Pathology.  Cord: 3 vessels with the following complications: Loose CAN x 1.  Cord pH: NA.  Placenta without any obvious abnormalities or adherent clots. Steady bleeding just after delivery of the placenta--moderate uterine atony controlled with bimanual compression, with rapid resolution to firmness. 1000 mcg Cytotech placed in rectum.  Anesthesia: Epidural  Episiotomy:   Lacerations: 2nd degree;Perineal; Superficial right labial--repaired for hemostasis Suture Repair: 3.0 vicryl Est. Blood Loss (mL):  745 cc  Mom to postpartum.  Baby to Couplet care / Skin to Skin. Family declines circumcision for [place]. Will check CBC this am, due to blood loss at delivery. Placenta to path due to vaginal bleeding today, ? abruption.  Cathrine Krizan 06/28/2015, 4:07 AM  Addendum: BP reported by RN as 170-180/110-120. Patient denies HA, visual sx, or epigastric pain. Is shivering post-delivery--BP 180/156 during shivering.  Chest clear Heart RRR without murmur Abd--fundus firm, U/3, lochia small amount Perineum--lacerations well-approximated, no bleeding. Ext DTR 2-3+, no clonus  I am not convinced of validity of current BPs--patient was completely normotensive during labor, but does have hx of hypertension in previous pregnancy.  Will check PIH labs and get PCR by cath. Close observation of BP at present. If persistently elevated without cause, will treat with IV labetalol.  Nigel Bridgeman, CNM 06/28/15 0430

## 2014-12-01 LAB — OB RESULTS CONSOLE HIV ANTIBODY (ROUTINE TESTING): HIV: NONREACTIVE

## 2014-12-02 LAB — OB RESULTS CONSOLE RPR: RPR: NONREACTIVE

## 2014-12-02 LAB — OB RESULTS CONSOLE ABO/RH: RH Type: NEGATIVE

## 2014-12-02 LAB — OB RESULTS CONSOLE RUBELLA ANTIBODY, IGM: Rubella: IMMUNE

## 2014-12-02 LAB — OB RESULTS CONSOLE HEPATITIS B SURFACE ANTIGEN: Hepatitis B Surface Ag: NEGATIVE

## 2014-12-31 LAB — OB RESULTS CONSOLE GC/CHLAMYDIA
Chlamydia: NEGATIVE
Gonorrhea: NEGATIVE

## 2015-03-24 ENCOUNTER — Inpatient Hospital Stay (HOSPITAL_COMMUNITY)
Admission: AD | Admit: 2015-03-24 | Discharge: 2015-03-24 | Disposition: A | Discharge status: Home / Self Care | Payer: Managed Care, Other (non HMO) | Source: Ambulatory Visit | Attending: Obstetrics and Gynecology | Admitting: Obstetrics and Gynecology

## 2015-03-24 ENCOUNTER — Other Ambulatory Visit: Payer: Self-pay | Admitting: Obstetrics and Gynecology

## 2015-03-24 DIAGNOSIS — Z3A24 24 weeks gestation of pregnancy: Secondary | ICD-10-CM | POA: Insufficient documentation

## 2015-03-24 MED ORDER — BETAMETHASONE SOD PHOS & ACET 6 (3-3) MG/ML IJ SUSP: 12.0000 mg | INTRAMUSCULAR | Status: AC

## 2015-03-24 MED ORDER — BETAMETHASONE SOD PHOS & ACET 6 (3-3) MG/ML IJ SUSP
12.0000 mg | Freq: Once | INTRAMUSCULAR | Status: AC
  Administered 2015-03-24: 12 mg via INTRAMUSCULAR
  Filled 2015-03-24: qty 2

## 2015-03-24 MED ORDER — BETAMETHASONE SOD PHOS & ACET 6 (3-3) MG/ML IJ SUSP: 12.0000 mg | Freq: Once | INTRAMUSCULAR | Status: AC

## 2015-03-25 ENCOUNTER — Inpatient Hospital Stay (HOSPITAL_COMMUNITY)
Admission: AD | Admit: 2015-03-25 | Discharge: 2015-03-25 | Disposition: A | Discharge status: Home / Self Care | Payer: Managed Care, Other (non HMO) | Source: Ambulatory Visit | Attending: Obstetrics and Gynecology | Admitting: Obstetrics and Gynecology

## 2015-03-25 DIAGNOSIS — Z3A00 Weeks of gestation of pregnancy not specified: Secondary | ICD-10-CM | POA: Diagnosis not present

## 2015-03-25 MED ORDER — BETAMETHASONE SOD PHOS & ACET 6 (3-3) MG/ML IJ SUSP
12.0000 mg | Freq: Once | INTRAMUSCULAR | Status: AC
  Administered 2015-03-25: 12 mg via INTRAMUSCULAR
  Filled 2015-03-25: qty 2

## 2015-03-25 NOTE — MAU Note (Signed)
Pt here for steroid injection.

## 2015-06-27 ENCOUNTER — Inpatient Hospital Stay (HOSPITAL_COMMUNITY)
Admission: AD | Admit: 2015-06-27 | Discharge: 2015-06-29 | DRG: 774 | Disposition: A | Discharge status: Home / Self Care | Payer: Managed Care, Other (non HMO) | Source: Ambulatory Visit | Attending: Obstetrics and Gynecology | Admitting: Obstetrics and Gynecology

## 2015-06-27 ENCOUNTER — Encounter (HOSPITAL_COMMUNITY): Payer: Self-pay | Admitting: *Deleted

## 2015-06-27 ENCOUNTER — Inpatient Hospital Stay (HOSPITAL_COMMUNITY): Payer: Managed Care, Other (non HMO)

## 2015-06-27 ENCOUNTER — Telehealth: Payer: Self-pay

## 2015-06-27 DIAGNOSIS — Z3A38 38 weeks gestation of pregnancy: Secondary | ICD-10-CM | POA: Diagnosis present

## 2015-06-27 DIAGNOSIS — O4593 Premature separation of placenta, unspecified, third trimester: Secondary | ICD-10-CM | POA: Diagnosis present

## 2015-06-27 DIAGNOSIS — O4693 Antepartum hemorrhage, unspecified, third trimester: Secondary | ICD-10-CM | POA: Diagnosis present

## 2015-06-27 DIAGNOSIS — Z6791 Unspecified blood type, Rh negative: Secondary | ICD-10-CM | POA: Diagnosis present

## 2015-06-27 DIAGNOSIS — O09523 Supervision of elderly multigravida, third trimester: Secondary | ICD-10-CM

## 2015-06-27 DIAGNOSIS — O26893 Other specified pregnancy related conditions, third trimester: Secondary | ICD-10-CM | POA: Diagnosis present

## 2015-06-27 DIAGNOSIS — O4690 Antepartum hemorrhage, unspecified, unspecified trimester: Secondary | ICD-10-CM | POA: Diagnosis present

## 2015-06-27 DIAGNOSIS — O09529 Supervision of elderly multigravida, unspecified trimester: Secondary | ICD-10-CM

## 2015-06-27 DIAGNOSIS — O4443 Low lying placenta NOS or without hemorrhage, third trimester: Secondary | ICD-10-CM

## 2015-06-27 DIAGNOSIS — O09299 Supervision of pregnancy with other poor reproductive or obstetric history, unspecified trimester: Secondary | ICD-10-CM

## 2015-06-27 HISTORY — DX: Complete placenta previa nos or without hemorrhage, unspecified trimester: O44.00

## 2015-06-27 HISTORY — DX: Serous retinal detachment, unspecified eye: H33.20

## 2015-06-27 LAB — URINALYSIS, ROUTINE W REFLEX MICROSCOPIC
Bilirubin Urine: NEGATIVE
Glucose, UA: 250 mg/dL — AB
Ketones, ur: NEGATIVE mg/dL
Nitrite: NEGATIVE
Protein, ur: NEGATIVE mg/dL
Specific Gravity, Urine: 1.025 (ref 1.005–1.030)
Urobilinogen, UA: 0.2 mg/dL (ref 0.0–1.0)
pH: 5 (ref 5.0–8.0)

## 2015-06-27 LAB — OB RESULTS CONSOLE GBS: GBS: NEGATIVE

## 2015-06-27 LAB — CBC
HCT: 37.3 % (ref 36.0–46.0)
Hemoglobin: 12.9 g/dL (ref 12.0–15.0)
MCH: 31 pg (ref 26.0–34.0)
MCHC: 34.6 g/dL (ref 30.0–36.0)
MCV: 89.7 fL (ref 78.0–100.0)
Platelets: 190 10*3/uL (ref 150–400)
RBC: 4.16 MIL/uL (ref 3.87–5.11)
RDW: 12.8 % (ref 11.5–15.5)
WBC: 14.1 10*3/uL — ABNORMAL HIGH (ref 4.0–10.5)

## 2015-06-27 LAB — URINE MICROSCOPIC-ADD ON

## 2015-06-27 LAB — TYPE AND SCREEN
ABO/RH(D): A NEG
Antibody Screen: NEGATIVE

## 2015-06-27 MED ORDER — FLEET ENEMA 7-19 GM/118ML RE ENEM: 1.0000 | ENEMA | RECTAL | Status: DC | PRN

## 2015-06-27 MED ORDER — LACTATED RINGERS IV SOLN: INTRAVENOUS | Status: DC

## 2015-06-27 MED ORDER — LIDOCAINE HCL (PF) 1 % IJ SOLN
30.0000 mL | INTRAMUSCULAR | Status: DC | PRN
  Administered 2015-06-28: 30 mL via SUBCUTANEOUS
  Filled 2015-06-27: qty 30

## 2015-06-27 MED ORDER — OXYCODONE-ACETAMINOPHEN 5-325 MG PO TABS: 1.0000 | ORAL_TABLET | ORAL | Status: DC | PRN

## 2015-06-27 MED ORDER — PHENYLEPHRINE 40 MCG/ML (10ML) SYRINGE FOR IV PUSH (FOR BLOOD PRESSURE SUPPORT)
80.0000 ug | PREFILLED_SYRINGE | INTRAVENOUS | Status: DC | PRN
  Filled 2015-06-27: qty 20

## 2015-06-27 MED ORDER — LACTATED RINGERS IV SOLN
INTRAVENOUS | Status: DC
  Administered 2015-06-27: 17:00:00 via INTRAVENOUS

## 2015-06-27 MED ORDER — FENTANYL 2.5 MCG/ML BUPIVACAINE 1/10 % EPIDURAL INFUSION (WH - ANES)
14.0000 mL/h | INTRAMUSCULAR | Status: DC | PRN
  Administered 2015-06-28: 14 mL/h via EPIDURAL
  Filled 2015-06-27: qty 125

## 2015-06-27 MED ORDER — FENTANYL CITRATE (PF) 100 MCG/2ML IJ SOLN: 100.0000 ug | INTRAMUSCULAR | Status: DC | PRN

## 2015-06-27 MED ORDER — DIPHENHYDRAMINE HCL 50 MG/ML IJ SOLN: 12.5000 mg | INTRAMUSCULAR | Status: DC | PRN

## 2015-06-27 MED ORDER — LACTATED RINGERS IV SOLN
500.0000 mL | INTRAVENOUS | Status: DC | PRN
  Administered 2015-06-28: 1000 mL via INTRAVENOUS

## 2015-06-27 MED ORDER — OXYTOCIN 40 UNITS IN LACTATED RINGERS INFUSION - SIMPLE MED
62.5000 mL/h | INTRAVENOUS | Status: DC
Start: 2015-06-27 — End: 2015-06-28
  Filled 2015-06-27: qty 1000

## 2015-06-27 MED ORDER — ONDANSETRON HCL 4 MG/2ML IJ SOLN: 4.0000 mg | Freq: Four times a day (QID) | INTRAMUSCULAR | Status: DC | PRN

## 2015-06-27 MED ORDER — CITRIC ACID-SODIUM CITRATE 334-500 MG/5ML PO SOLN: 30.0000 mL | ORAL | Status: DC | PRN

## 2015-06-27 MED ORDER — EPHEDRINE 5 MG/ML INJ: 10.0000 mg | INTRAVENOUS | Status: DC | PRN

## 2015-06-27 MED ORDER — ACETAMINOPHEN 325 MG PO TABS: 650.0000 mg | ORAL_TABLET | ORAL | Status: DC | PRN

## 2015-06-27 MED ORDER — OXYTOCIN BOLUS FROM INFUSION
500.0000 mL | INTRAVENOUS | Status: DC
  Administered 2015-06-28: 500 mL via INTRAVENOUS

## 2015-06-27 MED ORDER — OXYCODONE-ACETAMINOPHEN 5-325 MG PO TABS: 2.0000 | ORAL_TABLET | ORAL | Status: DC | PRN

## 2015-06-27 NOTE — MAU Note (Signed)
Pt states she had an episode of bright red vaginal bleeding at 1330 which saturated panties.  Denies pain but states she had some pressure prior to the bleeding.  Pt states the bleeding now is just bright red spotting.  Had previa earlier in pregnancy which had resolved.  Good fetal movement.

## 2015-06-27 NOTE — Telephone Encounter (Signed)
Patient call reports having some bleeding earlier this afternoon.  Patient states it was "kinda some pressure then a gush of blood."  Patient states it was "a couple of hours ago."  Patient states that it was bright red and required her to change her underwear.  Patient denies contractions, LoF, and reports good fetal movement.  Patient denies problems during the pregnancy stating " I don't think so."  Patient reports that it was a "fair amount" upon wiping.  Provider instructed patient to report to MAU for evaluation.  Patient states that husband is in [place] and it may be a couple of hours before he returns home.  Patient encouraged to try to arrive sooner than later and that MAU will be anticipating her arrival.  Patient verbalized understanding.  No other questions or concerns. JE, CNM

## 2015-06-27 NOTE — H&P (Signed)
Michaela Stanley is a 34 y.o. female, G2P1001 at 5 weeks, patient of Dr. Richardson Dopp at [facility], presenting for vaginal bleeding since this am--spoke with [facility] CNM on call late this afternoon and was directed to come in to MAU.  Upon arrival, she was noted to have a moderate amount of dark bleeding, Category 1 FHR, irregular mild UCs, and cervix was 4 cm, 50%, with uncertain presentation.  Denied recent IC.  Hx had been remarkable for previa noted at 20 4/7 weeks, with bleeding episode at 24 3/7 weeks.  Received Rhophylac and betamethasone.  [place] at 28 2/7 weeks showed resolution of previa to LL placenta, 2 cm away from os.    [place] in MAU showed NO PREVIA, vtx presentation, AFI of 16.57, 64%ile, and ? complex area inferiof left lateral aspect of the placenta.  Cervix on recheck after [place] was 4 cm, 75%, vtx, -2, with small amount dark blood on glove.  Patient Active Problem List   Diagnosis Date Noted  . H/O pre-eclampsia in prior pregnancy, currently pregnant--"variant of HELLP sx" 06/27/2015  . Vaginal bleeding in pregnancy 06/27/2015  . Blood type, Rh negative--received Rhophylac 03/24/15 06/27/2015  . Family history of genetic disease--paternal 2nd cousins with cystinosis, maternal family hx of ? myotonic dystrophy 07/17/2011    History of present pregnancy: Patient entered care at 10 1/7 weeks.   EDC of 07/11/15 was established by [place] at 20 4/7 weeks.   Anatomy scan:  20 4/7 weeks, with normal findings and a marginal previa.   Additional [place] evaluations:   24 3/7 weeks:  vtx by [place] 28 2/7 weeks:  Vtx, LL placenta, 2 cm away from os. More recent US in last 2 weeks, "normal placenta" location per patient report. Significant prenatal events:  Hx had been remarkable for previa noted at 20 4/7 weeks, with bleeding episode at 24 3/7 weeks.  Received Rhophylac and betamethasone.  [place] at 28 2/7 weeks showed resolution of previa to LL placenta, 2 cm away from os.  Had normal Panorama.  GBS negative per patient  report.  OB History    Gravida Para Term Preterm AB TAB SAB Ectopic Multiple Living   0 0 0 0 0 0 1    11/2011-[facility], 40 1/7 weeks, induced due to "variant of HELLP", approx 8 hour labor, received Mg for 24 hours after delivery.  Had genetic counseling due to family hx of cystinosis and ?myotonic dystrophy.  Past Medical History  Diagnosis Date  . History of chicken pox   . Hypertension   . Elevated liver enzymes   . Placenta previa   . Detached retina    Past Surgical History  Procedure Laterality Date  . Retinal detachment surgery     Family History: family history includes Asthma in her brother; Cancer in her father and mother; Learning disabilities in her brother; Other in her maternal aunt, maternal grandmother, maternal uncle, and other.  Paternal cousins with cystinosis; Maternal family with ? myotonic dystrophy.  Social History:  reports that she has never smoked. She has never used smokeless tobacco. She reports that she does not drink alcohol or use illicit drugs.  Patient is Caucasian, married to [place], who is involved and supportive.  She is employed at [employer].   Prenatal Transfer Tool  Maternal Diabetes: No Genetic Screening: Normal Panorama Maternal Ultrasounds/Referrals: Normal--previous previa, now resolved. Fetal Ultrasounds or other Referrals:  None Maternal Substance Abuse:  No Significant Maternal Medications:  None  Significant Maternal Lab Results: Lab values include: Group B Strep negative  TDAP NA Flu NA  ROS:  Vaginal bleeding, +FM, irregular contractions.  No Known Allergies   Dilation: 4 Effacement (%): 70, 80 Exam by:: VEmilee Hero CNM Blood pressure 129/70, pulse 63, temperature 98.1 F (36.7 C), temperature source Oral, resp. rate 18, SpO2 98 %, unknown if currently breastfeeding.  Chest clear Heart RRR without murmur Abd gravid, NT, FH 39 cm Pelvic: As above, small amount dark blood on glove after exam, no active  bleeding noted. Ext: WNL  FHR: Category 1 UCs:  Irregular, mild  Results for orders placed or performed during the hospital encounter of 06/27/15 (from the past 24 hour(s))  Urinalysis, Routine w reflex microscopic (not at [facility])     Status: Abnormal   Collection Time: 06/27/15  4:05 PM  Result Value Ref Range   Color, Urine YELLOW YELLOW   APPearance HAZY (A) CLEAR   Specific Gravity, Urine 1.025 1.005 - 1.030   pH 5.0 5.0 - 8.0   Glucose, UA 250 (A) NEGATIVE mg/dL   Hgb urine dipstick LARGE (A) NEGATIVE   Bilirubin Urine NEGATIVE NEGATIVE   Ketones, ur NEGATIVE NEGATIVE mg/dL   Protein, ur NEGATIVE NEGATIVE mg/dL   Urobilinogen, UA 0.2 0.0 - 1.0 mg/dL   Nitrite NEGATIVE NEGATIVE   Leukocytes, UA SMALL (A) NEGATIVE  Urine microscopic-add on     Status: Abnormal   Collection Time: 06/27/15  4:05 PM  Result Value Ref Range   Squamous Epithelial / LPF FEW (A) RARE   WBC, UA 3-6 <3 WBC/hpf   RBC / HPF TOO NUMEROUS TO COUNT <3 RBC/hpf   Bacteria, UA FEW (A) RARE  CBC     Status: Abnormal   Collection Time: 06/27/15  5:05 PM  Result Value Ref Range   WBC 14.1 (H) 4.0 - 10.5 K/uL   RBC 4.16 3.87 - 5.11 MIL/uL   Hemoglobin 12.9 12.0 - 15.0 g/dL   HCT 16.1 09.6 - 04.5 %   MCV 89.7 78.0 - 100.0 fL   MCH 31.0 26.0 - 34.0 pg   MCHC 34.6 30.0 - 36.0 g/dL   RDW 40.9 81.1 - 91.4 %   Platelets 190 150 - 400 K/uL  Type and screen     Status: None   Collection Time: 06/27/15  5:05 PM  Result Value Ref Range   ABO/RH(D) A NEG    Antibody Screen NEG    Sample Expiration 06/30/2015     Prenatal labs: ABO, Rh: --/--/A NEG (09/18 1705) Antibody: NEG (09/18 1705) Rubella:   Immune RPR: Nonreactive (02/24 0000) NR HBsAg: Negative (02/24 0000) Neg HIV: Non-reactive (02/23 0000) NR GBS:  Negative Sickle cell/Hgb electrophoresis: NA Pap:  Unknown GC:  Negatige 12/31/14 Chlamydia:  Negative 12/31/14 Genetic screenings: Normal Panorama, female Glucola:  WNL Other:   Hgb 12.8 at  NOB Normal TSH at NOB.   Assessment/Plan: IUP at 38 weeks Vaginal bleeding Prodromal vs early labor GBS negative  Plan: Admit to Birthing Suite per consult with Dr. Stefano Gaul. Will observe overnight for advancement of labor. Pad count Defer further plan of care decisions to Dr. Richardson Dopp in am, if no change in status. Patient and husband understand that if FHR issues, increased bleeding, or advancement of labor occurs, we will proceed with interventions/care as appropriate. Routine [facility] orders Pain med/epidural prn with advancement of labor. Repeat CBC in am, with HIV.   Nigel Bridgeman, CNM, [place] 06/27/2015, 8:48 PM

## 2015-06-27 NOTE — MAU Provider Note (Signed)
History    Michaela Stanley is a 33y.o. G2P1 at 2wks who presents for vaginal bleeding. She is a patient of [facility] and is under the care of Dr. Delrae Sawyers. Patient states that she had a "gush of blood" this am and when she called provider late afternoons she was advised to come in .  Upon arrival, patient states bleeding has decreased to spotting.  Patient reports good fetal movement and denies LOF and contractions. However, patient reports feelings of pressure that started prior to vaginal bleeding and continues.  Patient reports history of placenta previa that resolved at 24 weeks, but was given diagnosis of low-lying placenta.  Patient states she has been on pelvic rest and had last rhogam injection at 24 wks.    Patient Active Problem List   Diagnosis Date Noted  . Family history of genetic disease 07/17/2011    Chief Complaint  Patient presents with  . Vaginal Bleeding   HPI  OB History    Gravida Para Term Preterm AB TAB SAB Ectopic Multiple Living   0 0 0 0 0 0 1      Past Medical History  Diagnosis Date  . History of chicken pox   . Hypertension   . Elevated liver enzymes   . Placenta previa   . Detached retina     Past Surgical History  Procedure Laterality Date  . Retinal detachment surgery      Family History  Problem Relation Age of Onset  . Other Maternal Aunt     myotonic dystrophy?  . Other Maternal Uncle     myotonic dystrophy?  . Other Maternal Grandmother     myotonic dystrophy?  . Other Other     Father of baby's second cousins with cystinosis  . Cancer Mother     breast  . Cancer Father     colon  . Asthma Brother   . Learning disabilities Brother     ADHD    Social History  Substance Use Topics  . Smoking status: Never Smoker   . Smokeless tobacco: Never Used  . Alcohol Use: No    Allergies: No Known Allergies  Prescriptions prior to admission  Medication Sig Dispense Refill Last Dose  . Prenatal Vit-Fe Fumarate-FA (PRENATAL  MULTIVITAMIN) TABS Take 1 tablet by mouth daily.   06/26/2015 at Unknown time    ROS  See HPI Above Physical Exam   Blood pressure 129/70, pulse 63, temperature 98.1 F (36.7 C), temperature source Oral, resp. rate 18, SpO2 98 %, unknown if currently breastfeeding.  Results for orders placed or performed during the hospital encounter of 06/27/15 (from the past 24 hour(s))  Urinalysis, Routine w reflex microscopic (not at [facility])     Status: Abnormal   Collection Time: 06/27/15  4:05 PM  Result Value Ref Range   Color, Urine YELLOW YELLOW   APPearance HAZY (A) CLEAR   Specific Gravity, Urine 1.025 1.005 - 1.030   pH 5.0 5.0 - 8.0   Glucose, UA 250 (A) NEGATIVE mg/dL   Hgb urine dipstick LARGE (A) NEGATIVE   Bilirubin Urine NEGATIVE NEGATIVE   Ketones, ur NEGATIVE NEGATIVE mg/dL   Protein, ur NEGATIVE NEGATIVE mg/dL   Urobilinogen, UA 0.2 0.0 - 1.0 mg/dL   Nitrite NEGATIVE NEGATIVE   Leukocytes, UA SMALL (A) NEGATIVE  Urine microscopic-add on     Status: Abnormal   Collection Time: 06/27/15  4:05 PM  Result Value Ref Range   Squamous Epithelial /  LPF FEW (A) RARE   WBC, UA 3-6 <3 WBC/hpf   RBC / HPF TOO NUMEROUS TO COUNT <3 RBC/hpf   Bacteria, UA FEW (A) RARE    Physical Exam  Genitourinary: There is bleeding in the vagina.  Sterile Speculum Exam: -Vulva: Small amt bright red blood noted on thighs, in pubic hair, and at introitus.  Labia separated and blood running down perineum -Vaginal Vault: Moderate amt bright red blood noted -Cervix: Unable to visualize os Bimanual Exam: 4/50/ Provider questions presenting part as cephalic vs breech vs cephalic with placenta obstructing os.   [place]: Vertex  FHR:125 bpm, Mod Var, -Decels, +Accels UC: Q3-52min, palpates mild to moderate ED Course  Assessment: IUP at 38wks Cat I FT Vaginal Bleeding H/O Placenta Previa Low lying placenta Presenting Part Questionable  Plan: -Discussed PE findings with patient -Informed of need  for [place] for presentation of fetus and placenta -Start IV, LR infusion, T&S, RPR, CBC -Dr. AVS consulted and agrees with plan above--no additional recommendations or orders given  Follow Up (1900) -Report given to V.Emilee Hero, CNM   [facility], Lama Narayanan LYNN CNM, MSN 06/27/2015 5:00 PM

## 2015-06-28 ENCOUNTER — Inpatient Hospital Stay (HOSPITAL_COMMUNITY): Payer: Managed Care, Other (non HMO) | Admitting: Anesthesiology

## 2015-06-28 ENCOUNTER — Encounter (HOSPITAL_COMMUNITY): Payer: Self-pay | Admitting: General Practice

## 2015-06-28 LAB — COMPREHENSIVE METABOLIC PANEL WITH GFR
ALT: 12 U/L — ABNORMAL LOW (ref 14–54)
AST: 19 U/L (ref 15–41)
Albumin: 2.6 g/dL — ABNORMAL LOW (ref 3.5–5.0)
Alkaline Phosphatase: 101 U/L (ref 38–126)
Anion gap: 5 (ref 5–15)
BUN: 8 mg/dL (ref 6–20)
CO2: 24 mmol/L (ref 22–32)
Calcium: 8.1 mg/dL — ABNORMAL LOW (ref 8.9–10.3)
Chloride: 105 mmol/L (ref 101–111)
Creatinine, Ser: 0.66 mg/dL (ref 0.44–1.00)
GFR calc Af Amer: >60 mL/min
GFR calc non Af Amer: >60 mL/min
Glucose, Bld: 92 mg/dL (ref 65–99)
Potassium: 3.6 mmol/L (ref 3.5–5.1)
Sodium: 134 mmol/L — ABNORMAL LOW (ref 135–145)
Total Bilirubin: 0.8 mg/dL (ref 0.3–1.2)
Total Protein: 5.1 g/dL — ABNORMAL LOW (ref 6.5–8.1)

## 2015-06-28 LAB — CBC
HCT: 34.7 % — ABNORMAL LOW (ref 36.0–46.0)
Hemoglobin: 11.5 g/dL — ABNORMAL LOW (ref 12.0–15.0)
MCH: 29.9 pg (ref 26.0–34.0)
MCHC: 33.1 g/dL (ref 30.0–36.0)
MCV: 90.4 fL (ref 78.0–100.0)
Platelets: 195 10*3/uL (ref 150–400)
RBC: 3.84 MIL/uL — ABNORMAL LOW (ref 3.87–5.11)
RDW: 12.8 % (ref 11.5–15.5)
WBC: 18.9 10*3/uL — ABNORMAL HIGH (ref 4.0–10.5)

## 2015-06-28 LAB — LACTATE DEHYDROGENASE: LDH: 125 U/L (ref 98–192)

## 2015-06-28 LAB — PROTEIN / CREATININE RATIO, URINE
Creatinine, Urine: 108 mg/dL
Protein Creatinine Ratio: 0.24 mg/mg{creat} — ABNORMAL HIGH (ref 0.00–0.15)
Total Protein, Urine: 26 mg/dL

## 2015-06-28 LAB — RPR: RPR Ser Ql: NONREACTIVE

## 2015-06-28 LAB — URIC ACID: Uric Acid, Serum: 3.5 mg/dL (ref 2.3–6.6)

## 2015-06-28 MED ORDER — BENZOCAINE-MENTHOL 20-0.5 % EX AERO
1.0000 "application " | INHALATION_SPRAY | CUTANEOUS | Status: DC | PRN
  Filled 2015-06-28 (×2): qty 56

## 2015-06-28 MED ORDER — DIBUCAINE 1 % RE OINT
1.0000 "application " | TOPICAL_OINTMENT | RECTAL | Status: DC | PRN
  Filled 2015-06-28: qty 28

## 2015-06-28 MED ORDER — LABETALOL HCL 5 MG/ML IV SOLN
20.0000 mg | INTRAVENOUS | Status: DC | PRN
  Administered 2015-06-28: 20 mg via INTRAVENOUS

## 2015-06-28 MED ORDER — ONDANSETRON HCL 4 MG PO TABS: 4.0000 mg | ORAL_TABLET | ORAL | Status: DC | PRN

## 2015-06-28 MED ORDER — LIDOCAINE HCL (PF) 1 % IJ SOLN
INTRAMUSCULAR | Status: DC | PRN
  Administered 2015-06-28: 3 mL
  Administered 2015-06-28 (×2): 5 mL

## 2015-06-28 MED ORDER — PRENATAL MULTIVITAMIN CH
1.0000 | ORAL_TABLET | Freq: Every day | ORAL | Status: DC
  Administered 2015-06-28 – 2015-06-29 (×2): 1 via ORAL
  Filled 2015-06-28 (×2): qty 1

## 2015-06-28 MED ORDER — SENNOSIDES-DOCUSATE SODIUM 8.6-50 MG PO TABS
2.0000 | ORAL_TABLET | ORAL | Status: DC
  Administered 2015-06-29: 2 via ORAL
  Filled 2015-06-28: qty 2

## 2015-06-28 MED ORDER — IBUPROFEN 600 MG PO TABS
600.0000 mg | ORAL_TABLET | Freq: Four times a day (QID) | ORAL | Status: DC
  Administered 2015-06-28 – 2015-06-29 (×6): 600 mg via ORAL
  Filled 2015-06-28 (×6): qty 1

## 2015-06-28 MED ORDER — ONDANSETRON HCL 4 MG/2ML IJ SOLN: 4.0000 mg | INTRAMUSCULAR | Status: DC | PRN

## 2015-06-28 MED ORDER — LABETALOL HCL 5 MG/ML IV SOLN
20.0000 mg | INTRAVENOUS | Status: DC | PRN
  Filled 2015-06-28: qty 4

## 2015-06-28 MED ORDER — OXYCODONE-ACETAMINOPHEN 5-325 MG PO TABS: 2.0000 | ORAL_TABLET | ORAL | Status: DC | PRN

## 2015-06-28 MED ORDER — LANOLIN HYDROUS EX OINT: TOPICAL_OINTMENT | CUTANEOUS | Status: DC | PRN

## 2015-06-28 MED ORDER — OXYCODONE-ACETAMINOPHEN 5-325 MG PO TABS: 1.0000 | ORAL_TABLET | ORAL | Status: DC | PRN

## 2015-06-28 MED ORDER — DIPHENHYDRAMINE HCL 25 MG PO CAPS: 25.0000 mg | ORAL_CAPSULE | Freq: Four times a day (QID) | ORAL | Status: DC | PRN

## 2015-06-28 MED ORDER — MISOPROSTOL 200 MCG PO TABS
ORAL_TABLET | ORAL | Status: AC
  Filled 2015-06-28: qty 5

## 2015-06-28 MED ORDER — INFLUENZA VAC SPLIT QUAD 0.5 ML IM SUSY
0.5000 mL | PREFILLED_SYRINGE | INTRAMUSCULAR | Status: AC
  Administered 2015-06-29: 0.5 mL via INTRAMUSCULAR

## 2015-06-28 MED ORDER — WITCH HAZEL-GLYCERIN EX PADS: 1.0000 "application " | MEDICATED_PAD | CUTANEOUS | Status: DC | PRN

## 2015-06-28 MED ORDER — SIMETHICONE 80 MG PO CHEW: 80.0000 mg | CHEWABLE_TABLET | ORAL | Status: DC | PRN

## 2015-06-28 MED ORDER — HYDRALAZINE HCL 20 MG/ML IJ SOLN: 10.0000 mg | Freq: Once | INTRAMUSCULAR | Status: DC | PRN

## 2015-06-28 MED ORDER — TETANUS-DIPHTH-ACELL PERTUSSIS 5-2.5-18.5 LF-MCG/0.5 IM SUSP
0.5000 mL | Freq: Once | INTRAMUSCULAR | Status: AC
  Administered 2015-06-29: 0.5 mL via INTRAMUSCULAR

## 2015-06-28 MED ORDER — MISOPROSTOL 200 MCG PO TABS
1000.0000 ug | ORAL_TABLET | Freq: Once | ORAL | Status: AC
  Administered 2015-06-28: 1000 ug via RECTAL

## 2015-06-28 MED ORDER — ZOLPIDEM TARTRATE 5 MG PO TABS: 5.0000 mg | ORAL_TABLET | Freq: Every evening | ORAL | Status: DC | PRN

## 2015-06-28 MED ORDER — ACETAMINOPHEN 325 MG PO TABS
650.0000 mg | ORAL_TABLET | ORAL | Status: DC | PRN
  Administered 2015-06-28 (×2): 650 mg via ORAL
  Filled 2015-06-28 (×2): qty 2

## 2015-06-28 NOTE — Anesthesia Preprocedure Evaluation (Signed)

## 2015-06-28 NOTE — Anesthesia Postprocedure Evaluation (Signed)
  Anesthesia Post-op Note  Patient: Michaela Stanley  Procedure(s) Performed: * No procedures listed *  Patient Location: Mother/Baby  Anesthesia Type:Epidural  Level of Consciousness: awake, alert , oriented and patient cooperative  Airway and Oxygen Therapy: Patient Spontanous Breathing  Post-op Pain: mild  Post-op Assessment: Patient's Cardiovascular Status Stable, Respiratory Function Stable, No headache and Patient able to bend at knees and bear weight;states back sore from insertion site but only mildly;no residual numbess noted              Post-op Vital Signs: Reviewed and stable  Last Vitals:  Filed Vitals:   06/28/15 1237  BP: 119/73  Pulse: 65  Temp: 37.3 C  Resp: 16    Complications: No apparent anesthesia complications

## 2015-06-28 NOTE — Progress Notes (Signed)
Post Partum Day 0 svd  Subjective:  feels tired otherwise no complaints. She denies pelvic pain... Bleeding has decreased. She does not desire circumcision of her son.   Objective: Blood pressure 119/73, pulse 65, temperature 99.1 F (37.3 C), temperature source Oral, resp. rate 16, height  (1.727 m), weight 90.719 kg (200 lb), SpO2 98 %, unknown if currently breastfeeding.  Physical Exam:  General: alert and cooperative Lochia: appropriate Uterine Fundus: firm Incision: NA DVT Evaluation: No evidence of DVT seen on physical exam.   Recent Labs  06/27/15 1705 06/28/15 0545  HGB 12.9 11.5*  HCT 37.3 34.7*    Assessment/Plan: Breastfeeding and Lactation consult  Routine postpartum care.    LOS: 1 day   Michaela Stanley J. 06/28/2015, 6:22 PM

## 2015-06-28 NOTE — Progress Notes (Signed)
Subjective: Comfortable with epidural.  Objective: BP 107/78 mmHg  Pulse 67  Temp(Src) 98.6 F (37 C) (Oral)  Resp 18  Ht  (1.727 m)  Wt 90.719 kg (200 lb)  BMI 30.42 kg/m2  SpO2 100%   Total I/O In: -  Out: 150 [Urine:150]  FHT: Category 1 UC:   irregular, every 2-5 minutes, moderate SVE:   Dilation: 6.5 Effacement (%): 90 Station: -2 Exam by:: Nigel Bridgeman  Vtx well-applied to cervix No bleeding noted.  Assessment:  Progressive labor Bleeding issue stable  Plan: Offered AROM--patient declines any intervention at present. Will continue to observe.  Ronna Herskowitz CNM, [place] 06/28/2015, 2:08 AM

## 2015-06-28 NOTE — Progress Notes (Signed)
Patient has now been transferred to [facility].  Filed Vitals:   06/28/15 0500 06/28/15 0510 06/28/15 0520 06/28/15 0606  BP: 93/64 103/68 138/94 115/66  Pulse: 72 103  67  Temp:    99.1 F (37.3 C)  TempSrc:    Oral  Resp:    20  Height:      Weight:      SpO2:       CBC    Component Value Date/Time   WBC 18.9* 06/28/2015 0545   RBC 3.84* 06/28/2015 0545   HGB 11.5* 06/28/2015 0545   HCT 34.7* 06/28/2015 0545   PLT 195 06/28/2015 0545   MCV 90.4 06/28/2015 0545   MCH 29.9 06/28/2015 0545   MCHC 33.1 06/28/2015 0545   RDW 12.8 06/28/2015 0545   LYMPHSABS 1.9 11/30/2011 2220   MONOABS 0.9 11/30/2011 2220   EOSABS 0.0 11/30/2011 2220   BASOSABS 0.0 11/30/2011 2220   CMP     Component Value Date/Time   NA 134* 06/28/2015 0545   K 3.6 06/28/2015 0545   CL 105 06/28/2015 0545   CO2 24 06/28/2015 0545   GLUCOSE 92 06/28/2015 0545   BUN 8 06/28/2015 0545   CREATININE 0.66 06/28/2015 0545   CALCIUM 8.1* 06/28/2015 0545   PROT 5.1* 06/28/2015 0545   ALBUMIN 2.6* 06/28/2015 0545   AST 19 06/28/2015 0545   ALT 12* 06/28/2015 0545   ALKPHOS 101 06/28/2015 0545   BILITOT 0.8 06/28/2015 0545   GFRNONAA >60 06/28/2015 0545   GFRAA >60 06/28/2015 0545   LDH 125 Uric acid 3.5 PCR 0.24  Dr. Stefano Gaul updated for communication with Dr. Richardson Dopp. Will continue to observe at present.  Nigel Bridgeman, CNM 06/28/15 0630

## 2015-06-28 NOTE — Lactation Note (Signed)
This note was copied from the chart of Michaela Stanley. Lactation Consultation Note  P2.  States she had difficulty bf first child due to overbite. Baby latched in cradle upon entering.  Sucks and swallows observed. Discussed cluster feeding and encouraged mother to compress breast during feeding. Reviewed deep latch.  Mom encouraged to feed baby 8-12 times/24 hours and with feeding cues.  Mom made aware of O/P services, breastfeeding support groups, community resources, and our phone # for post-discharge questions.    Patient Name: Michaela Aariel Ems [id number] Date: 06/28/2015 Reason for consult: Initial assessment   Maternal Data Does the patient have breastfeeding experience prior to this delivery?: Yes  Feeding Feeding Type: Breast Fed Length of feed: 40 min  LATCH Score/Interventions Latch: Grasps breast easily, tongue down, lips flanged, rhythmical sucking. Intervention(s): Breast massage  Audible Swallowing: A few with stimulation Intervention(s): Skin to skin  Type of Nipple: Everted at rest and after stimulation  Comfort (Breast/Nipple): Soft / non-tender     Hold (Positioning): No assistance needed to correctly position infant at breast.  LATCH Score: 9  Lactation Tools Discussed/Used     Consult Status Consult Status: Follow-up Date: 06/29/15 Follow-up type: In-patient    Dahlia Byes [facility] 06/28/2015, 3:44 PM

## 2015-06-28 NOTE — Progress Notes (Signed)
Subjective: Requesting epidural--feels UCs are stronger and closer than at admission.    Objective: BP 118/78 mmHg  Pulse 65  Temp(Src) 98.9 F (37.2 C) (Oral)  Resp 20  Ht  (1.727 m)  Wt 90.719 kg (200 lb)  BMI 30.42 kg/m2  SpO2 98%      FHT: Category 1 UC:   irregular, every 2-5 minutes, moderate to palpation SVE:   Dilation: 5 Effacement (%): 80 Station: -1 Exam by:: Nigel Bridgeman Small amount BRB on pad, no active bleeding noted. BBOW  Assessment:  Early labor Third trimester bleeding, ? small abruption GBS negative  Plan: Will place epidural Routine labor care.  Tadhg Eskew CNM, [place] 06/28/2015, 12:01 AM

## 2015-06-28 NOTE — Anesthesia Procedure Notes (Signed)
Epidural Patient location during procedure: OB  Staffing Anesthesiologist: Bridgit Eynon Performed by: anesthesiologist   Preanesthetic Checklist Completed: patient identified, site marked, surgical consent, pre-op evaluation, timeout performed, IV checked, risks and benefits discussed and monitors and equipment checked  Epidural Patient position: sitting Prep: DuraPrep Patient monitoring: heart rate, continuous pulse ox and blood pressure Approach: right paramedian Location: L3-L4 Injection technique: LOR saline  Needle:  Needle type: Tuohy  Needle gauge: 17 G Needle length: 9 cm and 9 Needle insertion depth: 6 cm Catheter type: closed end flexible Catheter size: 20 Guage Catheter at skin depth: 10 cm Test dose: negative  Assessment Events: blood not aspirated, injection not painful, no injection resistance, negative IV test and no paresthesia  Additional Notes Patient identified. Risks/Benefits/Options discussed with patient including but not limited to bleeding, infection, nerve damage, paralysis, failed block, incomplete pain control, headache, blood pressure changes, nausea, vomiting, reactions to medication both or allergic, itching and postpartum back pain. Confirmed with bedside nurse the patient's most recent platelet count. Confirmed with patient that they are not currently taking any anticoagulation, have any bleeding history or any family history of bleeding disorders. Patient expressed understanding and wished to proceed. All questions were answered. Sterile technique was used throughout the entire procedure. Please see nursing notes for vital signs. Test dose was given through epidural needle and negative prior to continuing to dose epidural or start infusion. Warning signs of high block given to the patient including shortness of breath, tingling/numbness in hands, complete motor block, or any concerning symptoms with instructions to call for help. Patient was given  instructions on fall risk and not to get out of bed. All questions and concerns addressed with instructions to call with any issues.   

## 2015-06-29 LAB — CBC
HCT: 31.3 % — ABNORMAL LOW (ref 36.0–46.0)
Hemoglobin: 10.1 g/dL — ABNORMAL LOW (ref 12.0–15.0)
MCH: 29.3 pg (ref 26.0–34.0)
MCHC: 32.3 g/dL (ref 30.0–36.0)
MCV: 90.7 fL (ref 78.0–100.0)
Platelets: 164 10*3/uL (ref 150–400)
RBC: 3.45 MIL/uL — ABNORMAL LOW (ref 3.87–5.11)
RDW: 13.1 % (ref 11.5–15.5)
WBC: 11.3 10*3/uL — ABNORMAL HIGH (ref 4.0–10.5)

## 2015-06-29 MED ORDER — RHO D IMMUNE GLOBULIN 1500 UNIT/2ML IJ SOSY
300.0000 ug | PREFILLED_SYRINGE | Freq: Once | INTRAMUSCULAR | Status: AC
  Administered 2015-06-29: 300 ug via INTRAVENOUS
  Filled 2015-06-29: qty 2

## 2015-06-29 MED ORDER — IBUPROFEN 600 MG PO TABS: 600.0000 mg | ORAL_TABLET | Freq: Four times a day (QID) | ORAL | Status: AC | PRN

## 2015-06-29 NOTE — Discharge Summary (Signed)
OB Discharge Summary     Patient Name: Michaela Stanley DOB: 01/30/1981 MRN: [id number]  Date of admission: 06/27/2015 Delivering MD: Nigel Bridgeman   Date of discharge: 06/29/2015  Admitting diagnosis: 38 WKS, BLEEDING Intrauterine pregnancy: [redacted]w[redacted]d     Secondary diagnosis: active labor.. Postpartum hemorrhage      Discharge diagnosis: Term Pregnancy Delivered                                                                                                Post partum procedures:cytotec 1000 mcg per rectum for postpartum hemorrhage   Augmentation: NONE  Complications: Placental Abruption questionable small abruptions   Hospital course:  Onset of Labor With Vaginal Delivery     34 y.o. yo [id number] at [redacted]w[redacted]d was admitted in Latent Laboron 06/27/2015. Patient had an uncomplicated labor course as follows:  Membrane Rupture Time/Date: 3:12 AM ,06/28/2015   Intrapartum Procedures: Episiotomy: None [1]                                         Lacerations:  2nd degree [3];Perineal [11]  Mediations and procedures used include: Cytotec  Patient had a delivery of a Viable infant. 06/28/2015  Information for the patient's newborn:  Elizebeth, Kluesner [[id number]]  Delivery Method: Vag-Spont    Pateint had an uncomplicated postpartum course.  She is ambulating, tolerating a regular diet, passing flatus, and urinating well. Patient is discharged home in stable condition on No discharge date for patient encounter.Marland Kitchen    Physical exam  Filed Vitals:   06/28/15 1237 06/28/15 1900 06/28/15 1940 06/29/15 0707  BP: 119/73 122/76 109/69 118/71  Pulse: 65 66 79 58  Temp: 99.1 F (37.3 C) 98.7 F (37.1 C) 99.2 F (37.3 C) 97.9 F (36.6 C)  TempSrc: Oral Oral Oral Oral  Resp: Height:      Weight:      SpO2: 98%  98% 99%   General: alert and cooperative Lochia: appropriate Uterine Fundus: firm Incision: N/A DVT Evaluation: No evidence of DVT seen on physical exam. Labs: Lab Results   Component Value Date   WBC 11.3* 06/29/2015   HGB 10.1* 06/29/2015   HCT 31.3* 06/29/2015   MCV 90.7 06/29/2015   PLT 164 06/29/2015   CMP Latest Ref Rng 06/28/2015  Glucose 65 - 99 mg/dL 92  BUN 6 - 20 mg/dL 8  Creatinine 7.42 - 5.95 mg/dL 6.38  Sodium 756 - 433 mmol/L 134(L)  Potassium 3.5 - 5.1 mmol/L 3.6  Chloride 101 - 111 mmol/L 105  CO2 22 - 32 mmol/L 24  Calcium 8.9 - 10.3 mg/dL 8.1(L)  Total Protein 6.5 - 8.1 g/dL 5.1(L)  Total Bilirubin 0.3 - 1.2 mg/dL 0.8  Alkaline Phos 38 - 126 U/L 101  AST 15 - 41 U/L 19  ALT 14 - 54 U/L 12(L)    Discharge instruction: per After Visit Summary and "Baby and Me Booklet".  Medications:  Current facility-administered medications:  .  acetaminophen (TYLENOL)  tablet 650 mg, 650 mg, Oral, Q4H PRN, Nigel Bridgeman, CNM, 650 mg at 06/28/15 1949 .  benzocaine-Menthol (DERMOPLAST) 20-0.5 % topical spray 1 application, 1 application, Topical, PRN, Nigel Bridgeman, CNM .  witch hazel-glycerin (TUCKS) pad 1 application, 1 application, Topical, PRN **AND** dibucaine (NUPERCAINAL) 1 % rectal ointment 1 application, 1 application, Rectal, PRN, Nigel Bridgeman, CNM .  diphenhydrAMINE (BENADRYL) capsule 25 mg, 25 mg, Oral, Q6H PRN, Nigel Bridgeman, CNM .  hydrALAZINE (APRESOLINE) injection 10 mg, 10 mg, Intravenous, Once PRN, Nigel Bridgeman, CNM .  hydrALAZINE (APRESOLINE) injection 10 mg, 10 mg, Intravenous, Once PRN, Nigel Bridgeman, CNM .  ibuprofen (ADVIL,MOTRIN) tablet 600 mg, 600 mg, Oral, 4 times per day, Nigel Bridgeman, CNM, 600 mg at 06/29/15 0616 .  Influenza vac split quadrivalent PF (FLUARIX) injection 0.5 mL, 0.5 mL, Intramuscular, Tomorrow-1000, Gerald Leitz, MD .  labetalol (NORMODYNE,TRANDATE) injection 20-80 mg, 20-80 mg, Intravenous, Q10 min PRN, Nigel Bridgeman, CNM .  labetalol (NORMODYNE,TRANDATE) injection 20-80 mg, 20-80 mg, Intravenous, Q10 min PRN, Nigel Bridgeman, CNM, 20 mg at 06/28/15 0448 .  lanolin ointment, , Topical, PRN, Nigel Bridgeman, CNM .   lidocaine (PF) (XYLOCAINE) 1 % injection 30 mL, 30 mL, Subcutaneous, PRN, Nigel Bridgeman, CNM, 30 mL at 06/28/15 0342 .  ondansetron (ZOFRAN) tablet 4 mg, 4 mg, Oral, Q4H PRN **OR** ondansetron (ZOFRAN) injection 4 mg, 4 mg, Intravenous, Q4H PRN, Nigel Bridgeman, CNM .  oxyCODONE-acetaminophen (PERCOCET/ROXICET) 5-325 MG per tablet 1 tablet, 1 tablet, Oral, Q4H PRN, Nigel Bridgeman, CNM .  oxyCODONE-acetaminophen (PERCOCET/ROXICET) 5-325 MG per tablet 2 tablet, 2 tablet, Oral, Q4H PRN, Nigel Bridgeman, CNM .  prenatal multivitamin tablet 1 tablet, 1 tablet, Oral, Q1200, Nigel Bridgeman, CNM, 1 tablet at 06/28/15 1246 .  senna-docusate (Senokot-S) tablet 2 tablet, 2 tablet, Oral, Q24H, Nigel Bridgeman, CNM, 2 tablet at 06/29/15 0010 .  simethicone (MYLICON) chewable tablet 80 mg, 80 mg, Oral, PRN, Nigel Bridgeman, CNM .  Tdap (BOOSTRIX) injection 0.5 mL, 0.5 mL, Intramuscular, Once, Nigel Bridgeman, CNM .  zolpidem (AMBIEN) tablet 5 mg, 5 mg, Oral, QHS PRN, Nigel Bridgeman, CNM  Facility-Administered Medications Ordered in Other Encounters:  .  betamethasone acetate-betamethasone sodium phosphate (CELESTONE) injection 12 mg, 12 mg, Intramuscular, Once, Gerald Leitz, MD .  lidocaine (PF) (XYLOCAINE) 1 % injection, , , Anesthesia Intra-op, Phillips Grout, MD, 3 mL at 06/28/15 0034  Diet: routine diet  Activity: Advance as tolerated. Pelvic rest for 6 weeks.   Outpatient follow up:6 weeks  Postpartum contraception: Condoms  Newborn Data: Live born female  Birth Weight: 7 lb 9.6 oz (3447 g) APGAR: 8, 9  Baby Feeding: Breast Disposition:home with mother   06/29/2015 Jessee Avers., MD

## 2015-06-29 NOTE — Progress Notes (Signed)
Post Partum Day 1 s/p SVD  Subjective: no complaints, up ad lib, voiding and tolerating PO.Marland Kitchen Patient request early discharge home .Marland Kitchen She denies any h/o depression   Objective: Blood pressure 118/71, pulse 58, temperature 97.9 F (36.6 C), temperature source Oral, resp. rate 17, height  (1.727 m), weight 90.719 kg (200 lb), SpO2 99 %, unknown if currently breastfeeding.  Physical Exam:  General: alert and cooperative Lochia: appropriate Uterine Fundus: firm Incision: NA DVT Evaluation: No evidence of DVT seen on physical exam.   Recent Labs  06/28/15 0545 06/29/15 0545  HGB 11.5* 10.1*  HCT 34.7* 31.3*    Assessment/Plan: Discharge home and Breastfeeding  Patient to follow up in the office in 6 weeks    LOS: 2 days   Donavon Kimrey J. 06/29/2015, 8:19 AM

## 2015-06-30 LAB — RH IG WORKUP (INCLUDES ABO/RH)
ABO/RH(D): A NEG
Fetal Screen: NEGATIVE
Gestational Age(Wks): 38.1
Unit division: 0

## 2015-10-15 ENCOUNTER — Other Ambulatory Visit: Payer: Self-pay | Admitting: Obstetrics and Gynecology

## 2015-10-15 ENCOUNTER — Other Ambulatory Visit (HOSPITAL_COMMUNITY)
Admission: RE | Admit: 2015-10-15 | Discharge: 2015-10-15 | Disposition: A | Discharge status: Home / Self Care | Payer: Managed Care, Other (non HMO) | Source: Ambulatory Visit | Attending: Obstetrics and Gynecology | Admitting: Obstetrics and Gynecology

## 2015-10-15 DIAGNOSIS — Z1151 Encounter for screening for human papillomavirus (HPV): Secondary | ICD-10-CM | POA: Insufficient documentation

## 2015-10-15 DIAGNOSIS — Z01419 Encounter for gynecological examination (general) (routine) without abnormal findings: Secondary | ICD-10-CM | POA: Insufficient documentation

## 2015-10-19 LAB — CYTOLOGY - PAP

## 2018-11-13 ENCOUNTER — Other Ambulatory Visit (HOSPITAL_COMMUNITY)
Admission: RE | Admit: 2018-11-13 | Discharge: 2018-11-13 | Disposition: A | Discharge status: Home / Self Care | Payer: Managed Care, Other (non HMO) | Source: Ambulatory Visit | Attending: Obstetrics and Gynecology | Admitting: Obstetrics and Gynecology

## 2018-11-13 ENCOUNTER — Other Ambulatory Visit: Payer: Self-pay | Admitting: Obstetrics and Gynecology

## 2018-11-13 DIAGNOSIS — Z01419 Encounter for gynecological examination (general) (routine) without abnormal findings: Secondary | ICD-10-CM | POA: Diagnosis present

## 2018-11-16 LAB — CYTOLOGY - PAP
Diagnosis: NEGATIVE
HPV: NOT DETECTED

## 2019-11-18 ENCOUNTER — Other Ambulatory Visit: Payer: Self-pay | Admitting: Obstetrics and Gynecology

## 2019-11-18 DIAGNOSIS — Z1231 Encounter for screening mammogram for malignant neoplasm of breast: Secondary | ICD-10-CM

## 2019-12-26 ENCOUNTER — Other Ambulatory Visit: Payer: Self-pay

## 2019-12-26 ENCOUNTER — Ambulatory Visit
Admission: RE | Admit: 2019-12-26 | Discharge: 2019-12-26 | Disposition: A | Discharge status: Home / Self Care | Payer: Managed Care, Other (non HMO) | Source: Ambulatory Visit | Attending: Obstetrics and Gynecology | Admitting: Obstetrics and Gynecology

## 2019-12-26 DIAGNOSIS — Z1231 Encounter for screening mammogram for malignant neoplasm of breast: Secondary | ICD-10-CM

## 2020-12-30 ENCOUNTER — Other Ambulatory Visit: Payer: Self-pay | Admitting: Obstetrics and Gynecology

## 2020-12-30 DIAGNOSIS — Z1231 Encounter for screening mammogram for malignant neoplasm of breast: Secondary | ICD-10-CM

## 2021-02-22 ENCOUNTER — Other Ambulatory Visit: Payer: Self-pay

## 2021-02-22 ENCOUNTER — Ambulatory Visit
Admission: RE | Admit: 2021-02-22 | Discharge: 2021-02-22 | Disposition: A | Discharge status: Home / Self Care | Payer: Managed Care, Other (non HMO) | Source: Ambulatory Visit | Attending: Obstetrics and Gynecology | Admitting: Obstetrics and Gynecology

## 2021-02-22 DIAGNOSIS — Z1231 Encounter for screening mammogram for malignant neoplasm of breast: Secondary | ICD-10-CM

## 2022-01-27 ENCOUNTER — Other Ambulatory Visit: Payer: Self-pay | Admitting: Obstetrics and Gynecology

## 2022-01-27 DIAGNOSIS — Z1231 Encounter for screening mammogram for malignant neoplasm of breast: Secondary | ICD-10-CM

## 2022-02-23 ENCOUNTER — Ambulatory Visit
Admission: RE | Admit: 2022-02-23 | Discharge: 2022-02-23 | Disposition: A | Discharge status: Home / Self Care | Payer: Managed Care, Other (non HMO) | Source: Ambulatory Visit | Attending: Obstetrics and Gynecology | Admitting: Obstetrics and Gynecology

## 2022-02-23 DIAGNOSIS — Z1231 Encounter for screening mammogram for malignant neoplasm of breast: Secondary | ICD-10-CM

## 2022-07-19 IMAGING — MG MM DIGITAL SCREENING BILAT W/ TOMO AND CAD
8 series · 8 of 24 positions shown · non-contrast
Comparison: Previous exam(s).

CLINICAL DATA: Screening.

EXAM:
DIGITAL SCREENING BILATERAL MAMMOGRAM WITH TOMOSYNTHESIS AND CAD
TECHNIQUE: Bilateral screening digital craniocaudal and mediolateral oblique
mammograms were obtained. Bilateral screening digital breast
tomosynthesis was performed. The images were evaluated with
computer-aided detection.

[L MLO synth-2D]
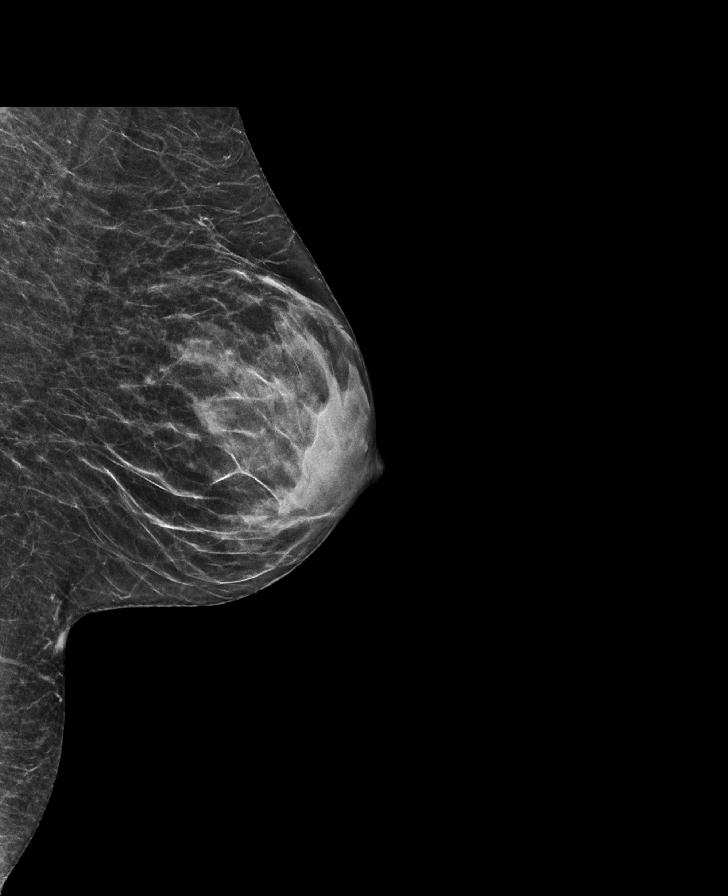

[R CC synth-2D]
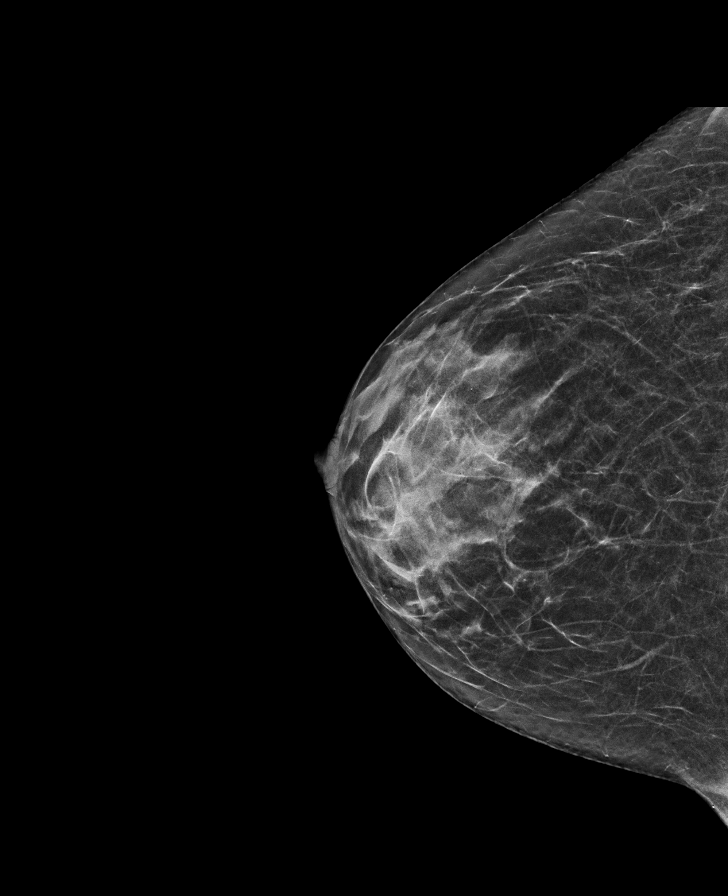

[R MLO synth-2D]
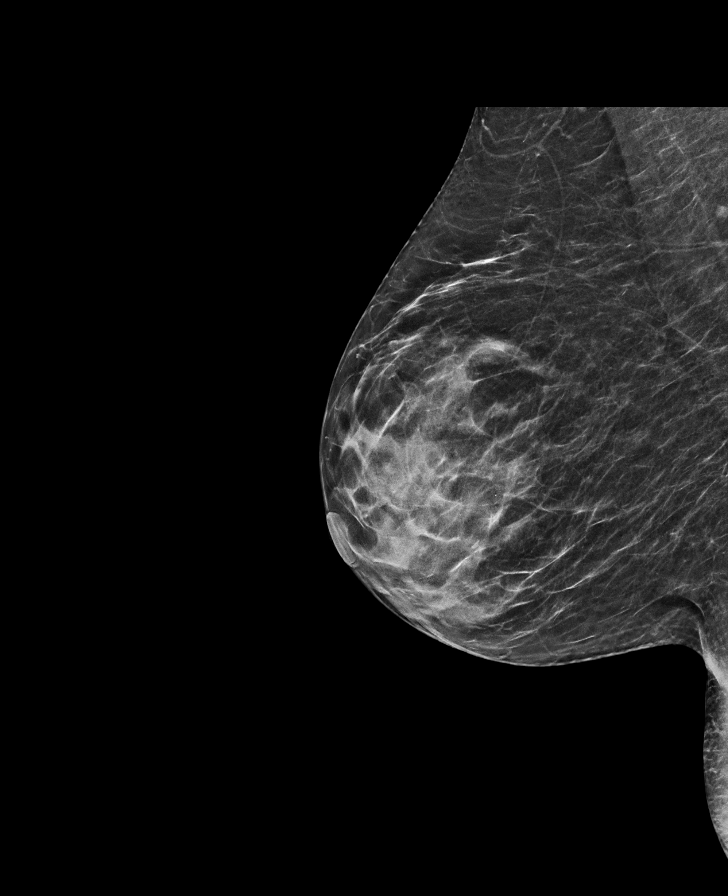

[L CC synth-2D]
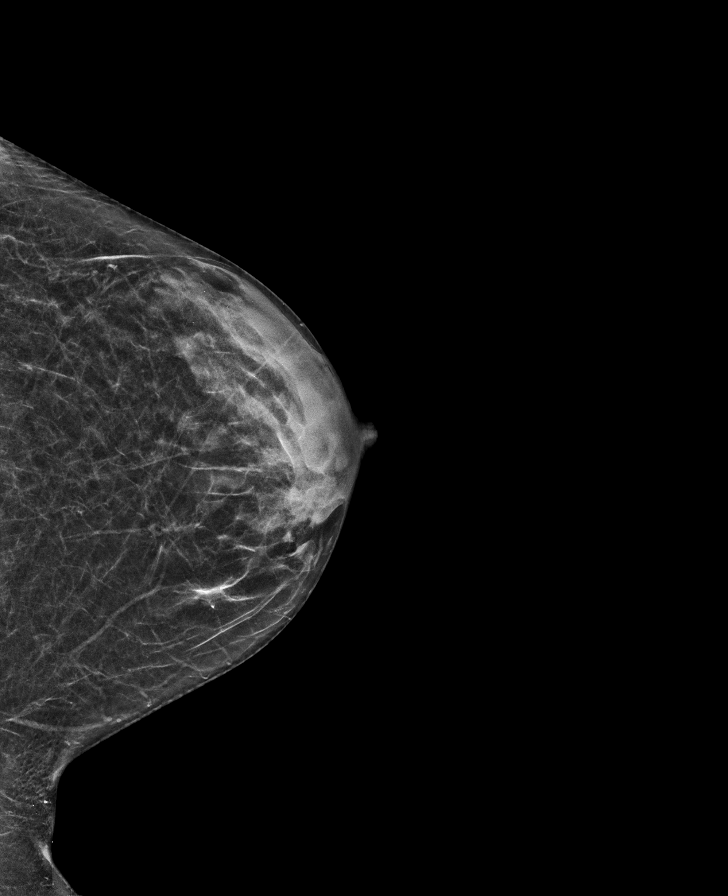

[L CC tomo · tomo slice 27/53.0]
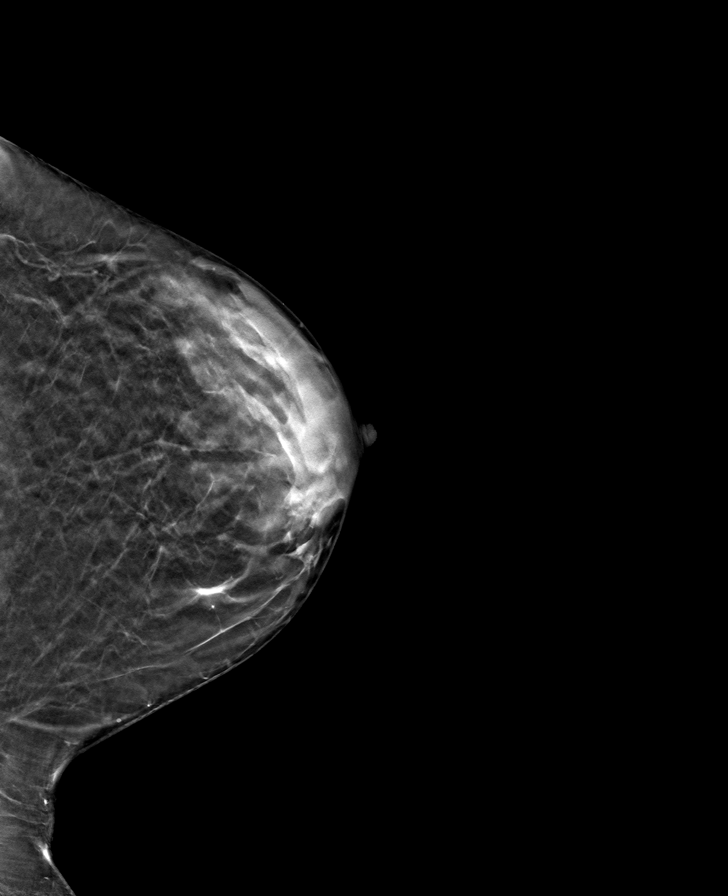

[L MLO tomo · tomo slice 27/54.0]
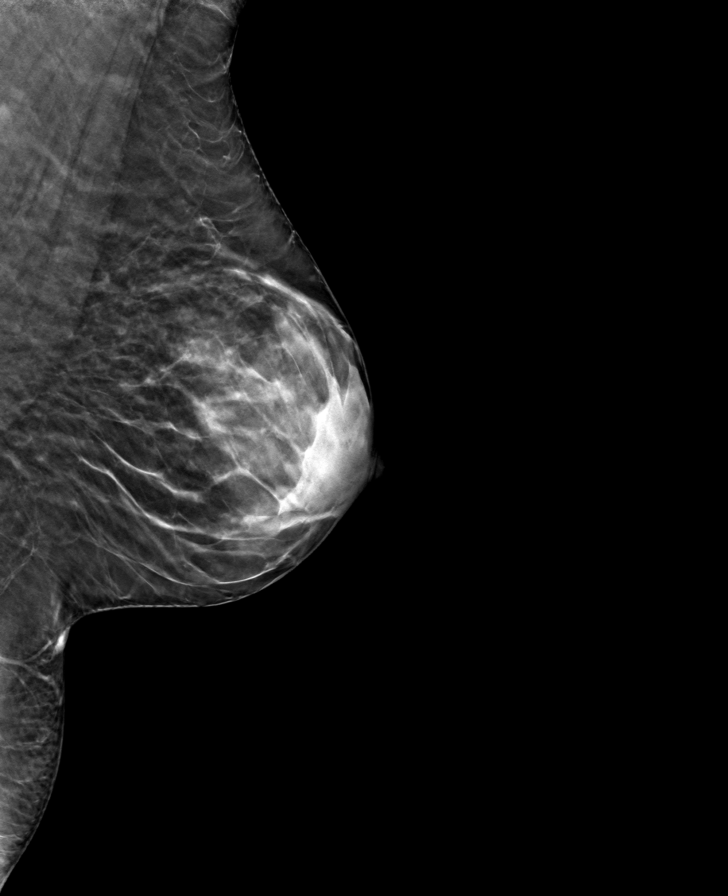

[R MLO tomo · tomo slice 28/55.0]
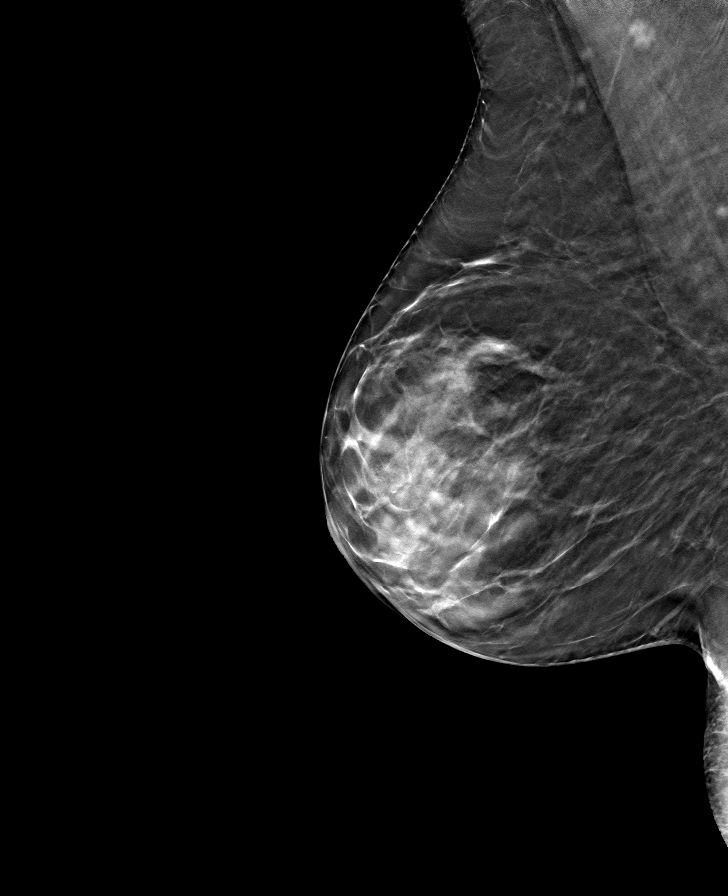

[R CC tomo · tomo slice 28/55.0]
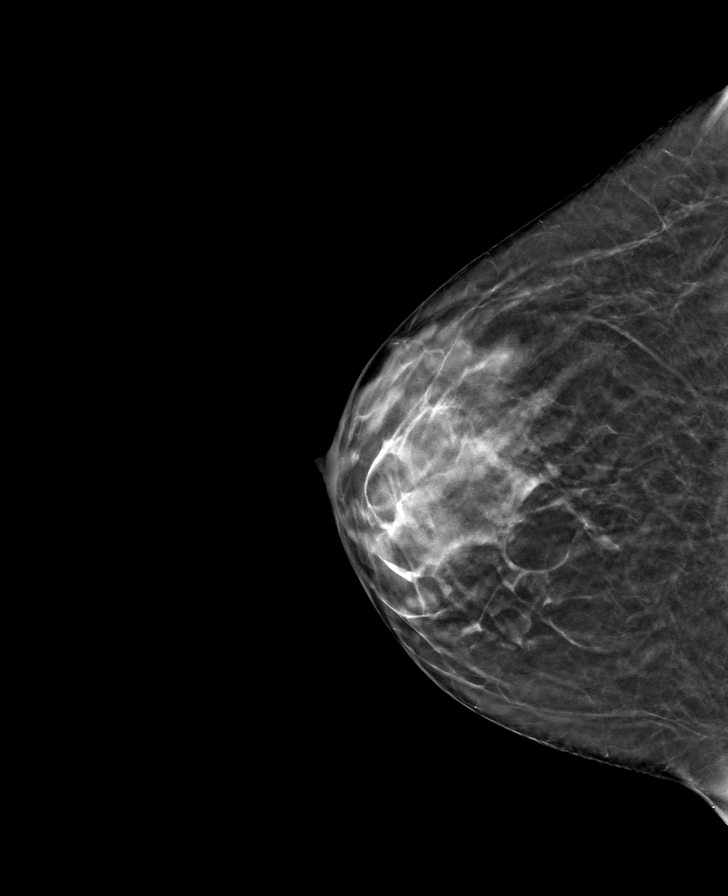

[8 of 24 positions shown; findings below may reference images not displayed]

ACR Breast Density Category c: The breast tissue is heterogeneously
dense, which may obscure small masses.
FINDINGS: There are no findings suspicious for malignancy.
IMPRESSION: No mammographic evidence of malignancy. A result letter of this
screening mammogram will be mailed directly to the patient.

RECOMMENDATION:
Screening mammogram in one year. (Code:Q3-W-BC3)

BI-RADS CATEGORY  1: Negative.

## 2023-01-11 ENCOUNTER — Other Ambulatory Visit: Payer: Self-pay | Admitting: Family Medicine

## 2023-01-11 DIAGNOSIS — Z1231 Encounter for screening mammogram for malignant neoplasm of breast: Secondary | ICD-10-CM

## 2023-03-01 ENCOUNTER — Ambulatory Visit
Admission: RE | Admit: 2023-03-01 | Discharge: 2023-03-01 | Disposition: A | Discharge status: Home / Self Care | Payer: Managed Care, Other (non HMO) | Source: Ambulatory Visit | Attending: Family Medicine | Admitting: Family Medicine

## 2023-03-01 DIAGNOSIS — Z1231 Encounter for screening mammogram for malignant neoplasm of breast: Secondary | ICD-10-CM

## 2023-04-17 ENCOUNTER — Other Ambulatory Visit (HOSPITAL_COMMUNITY)
Admission: RE | Admit: 2023-04-17 | Discharge: 2023-04-17 | Disposition: A | Discharge status: Home / Self Care | Payer: Managed Care, Other (non HMO) | Source: Ambulatory Visit | Attending: Nurse Practitioner | Admitting: Nurse Practitioner

## 2023-04-17 DIAGNOSIS — Z124 Encounter for screening for malignant neoplasm of cervix: Secondary | ICD-10-CM | POA: Insufficient documentation

## 2023-04-19 LAB — CYTOLOGY - PAP
Comment: NEGATIVE
Diagnosis: NEGATIVE
High risk HPV: NEGATIVE

## 2024-02-22 ENCOUNTER — Other Ambulatory Visit: Payer: Self-pay | Admitting: Nurse Practitioner

## 2024-02-22 DIAGNOSIS — Z1231 Encounter for screening mammogram for malignant neoplasm of breast: Secondary | ICD-10-CM

## 2024-03-05 ENCOUNTER — Ambulatory Visit
Admission: RE | Admit: 2024-03-05 | Discharge: 2024-03-05 | Disposition: A | Discharge status: Home / Self Care | Source: Ambulatory Visit | Attending: Nurse Practitioner | Admitting: Nurse Practitioner

## 2024-03-05 DIAGNOSIS — Z1231 Encounter for screening mammogram for malignant neoplasm of breast: Secondary | ICD-10-CM
